# Patient Record
Sex: Female | Born: 1995
Health system: Southern US, Community
[De-identification: ages and names within clinical notes are randomized; demographics above are authoritative.]

## PROBLEM LIST (undated history)

## (undated) DIAGNOSIS — T7840XA Allergy, unspecified, initial encounter: Secondary | ICD-10-CM

## (undated) DIAGNOSIS — M1990 Unspecified osteoarthritis, unspecified site: Secondary | ICD-10-CM

## (undated) DIAGNOSIS — R011 Cardiac murmur, unspecified: Secondary | ICD-10-CM

## (undated) DIAGNOSIS — J45909 Unspecified asthma, uncomplicated: Secondary | ICD-10-CM

## (undated) DIAGNOSIS — G43909 Migraine, unspecified, not intractable, without status migrainosus: Secondary | ICD-10-CM

## (undated) HISTORY — DX: Allergy, unspecified, initial encounter: T78.40XA

## (undated) HISTORY — PX: NO PAST SURGERIES: SHX2092

## (undated) HISTORY — DX: Unspecified asthma, uncomplicated: J45.909

## (undated) HISTORY — DX: Unspecified osteoarthritis, unspecified site: M19.90

---
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
## 2015-02-27 ENCOUNTER — Ambulatory Visit (INDEPENDENT_AMBULATORY_CARE_PROVIDER_SITE_OTHER): Payer: PRIVATE HEALTH INSURANCE | Admitting: Family Medicine

## 2015-02-27 ENCOUNTER — Ambulatory Visit (INDEPENDENT_AMBULATORY_CARE_PROVIDER_SITE_OTHER): Payer: PRIVATE HEALTH INSURANCE

## 2015-02-27 VITALS — BP 108/72 | HR 78 | Temp 98.2°F | Resp 16 | Ht 72.5 in | Wt 167.0 lb

## 2015-02-27 DIAGNOSIS — N921 Excessive and frequent menstruation with irregular cycle: Secondary | ICD-10-CM

## 2015-02-27 DIAGNOSIS — M79672 Pain in left foot: Secondary | ICD-10-CM

## 2015-02-27 DIAGNOSIS — G8929 Other chronic pain: Secondary | ICD-10-CM

## 2015-02-27 DIAGNOSIS — F341 Dysthymic disorder: Secondary | ICD-10-CM | POA: Diagnosis not present

## 2015-02-27 DIAGNOSIS — M549 Dorsalgia, unspecified: Secondary | ICD-10-CM

## 2015-02-27 LAB — POCT CBC
Granulocyte percent: 73.3 %G (ref 37–80)
HEMATOCRIT: 41.7 % (ref 37.7–47.9)
Hemoglobin: 13.7 g/dL (ref 12.2–16.2)
Lymph, poc: 1.7 (ref 0.6–3.4)
MCH, POC: 29.3 pg (ref 27–31.2)
MCHC: 32.9 g/dL (ref 31.8–35.4)
MCV: 88.9 fL (ref 80–97)
MID (CBC): 0.4 (ref 0–0.9)
MPV: 7.9 fL (ref 0–99.8)
POC GRANULOCYTE: 5.8 (ref 2–6.9)
POC LYMPH %: 21.1 % (ref 10–50)
POC MID %: 5.6 % (ref 0–12)
Platelet Count, POC: 155 10*3/uL (ref 142–424)
RBC: 4.69 M/uL (ref 4.04–5.48)
RDW, POC: 13.1 %
WBC: 7.9 10*3/uL (ref 4.6–10.2)

## 2015-02-27 MED ORDER — NAPROXEN 500 MG PO TABS: 500.0000 mg | ORAL_TABLET | Freq: Two times a day (BID) | ORAL | Status: DC

## 2015-02-27 MED ORDER — CYCLOBENZAPRINE HCL 10 MG PO TABS: 10.0000 mg | ORAL_TABLET | Freq: Every day | ORAL | Status: DC

## 2015-02-27 MED ORDER — FLUOXETINE HCL 20 MG PO CAPS: 20.0000 mg | ORAL_CAPSULE | Freq: Every day | ORAL | Status: DC

## 2015-02-27 NOTE — Progress Notes (Signed)
02/27/2015 at 7:23 PM  Cindy Boyle / DOB: [birth year] / MRN: [id number]  The patient  does not have a problem list on file.  SUBJECTIVE  Cindy Boyle is a 19 y.o. female who complains of multiple problems.  She complains of left sided lower back pain that started many years ago and was told that she has a herniated disk.  She has occasional flares of pain.  She was playing volleyball 2 weeks ago this made her back sore but was manageable. Since that time her pain has worsened and is now a 9.5/10.   Denies urinary symptoms, incontinence, leg weakness and paresthesia.  There is no radicular pattern to the pain. Has tired Ibuprofen 800 mg with good relief.    She complains of menorrhagia that has been on going for roughly three weeks ago.  She received a depo shot at the onset of symptoms.  This has happened before with the depo shot.  She has tried OCPs and this causes her to have two periods a month.  She would like a referral to GYN.  She complains of left ankle pain that started 2 weeks ago during a volleyball game.  Reports she "rolled" her ankle.  Initially had bruising and swelling but this has resolved.  Reports pain along the lateral aspect of the foot and ankle with walking.  Complains of depressive symptoms that started 2 years ago when she was a senior in high school.  Complains of poor sleep, loss of interest, dysthymic mood and poor concentration.  Denies SI/HI.  Has never tried an SSRI.     She  has a past medical history of Heart murmur; Migraines; Allergy; Arthritis; and Asthma.    Medications reviewed and updated by myself where necessary, and exist elsewhere in the encounter.   Depression screen PHQ 2/9 02/27/2015  Decreased Interest 2  Down, Depressed, Hopeless 1  PHQ - 2 Score 3  Altered sleeping 3  Tired, decreased energy 3  Change in appetite 3  Feeling bad or failure about yourself  0  Trouble concentrating 3  Moving slowly or fidgety/restless 1    Suicidal thoughts 0  PHQ-9 Score 16      Cindy Boyle has No Known Allergies. She  reports that she has never smoked. She does not have any smokeless tobacco history on file. She reports that she drinks alcohol. She reports that she does not use illicit drugs. She  reports that she currently engages in sexual activity. The patient  has no past surgical history on file.  Her family history is not on file.  Review of Systems  Constitutional: Negative for fever and chills.  Eyes: Negative.   Respiratory: Negative for cough.   Cardiovascular: Negative for chest pain.  Gastrointestinal: Negative for nausea.  Genitourinary: Negative for dysuria, urgency, frequency and hematuria.  Musculoskeletal: Positive for back pain. Negative for myalgias, joint pain, falls and neck pain.  Skin: Negative for itching and rash.  Neurological: Negative for dizziness, weakness and headaches.  Endo/Heme/Allergies: Does not bruise/bleed easily.  Psychiatric/Behavioral: Positive for depression. The patient has insomnia.     OBJECTIVE  Her  height is 6' 0.5" (1.842 m) and weight is 167 lb (75.751 kg). Her temperature is 98.2 F (36.8 C). Her blood pressure is 108/72 and her pulse is 78. Her respiration is 16 and oxygen saturation is 99%.  The patient's body mass index is 22.33 kg/(m^2).  Physical Exam  Constitutional: She is oriented to person, place, and time.  She appears well-developed and well-nourished. No distress.  HENT:  Head: Normocephalic.  Right Ear: External ear normal.  Left Ear: External ear normal.  Nose: Nose normal.  Eyes: Conjunctivae are normal. Pupils are equal, round, and reactive to light.  Neck: No thyromegaly present.  Cardiovascular: Normal rate.   Respiratory: Effort normal and breath sounds normal.  GI: She exhibits no distension.  Musculoskeletal: Normal range of motion. She exhibits no edema.       Lumbar back: She exhibits normal range of motion, no tenderness, no bony  tenderness, no swelling, no edema, no deformity, no laceration, no pain, no spasm and normal pulse.       Feet:  Neurological: She is alert and oriented to person, place, and time.  Skin: Skin is warm and dry. She is not diaphoretic.  Psychiatric: She has a normal mood and affect. Her speech is normal and behavior is normal. Judgment and thought content normal. Cognition and memory are normal.    Results for orders placed or performed in visit on 02/27/15 (from the past 24 hour(s))  POCT CBC     Status: None   Collection Time: 02/27/15  6:59 PM  Result Value Ref Range   WBC 7.9 4.6 - 10.2 K/uL   Lymph, poc 1.7 0.6 - 3.4   POC LYMPH PERCENT 21.1 10 - 50 %L   MID (cbc) 0.4 0 - 0.9   POC MID % 5.6 0 - 12 %M   POC Granulocyte 5.8 2 - 6.9   Granulocyte percent 73.3 37 - 80 %G   RBC 4.69 4.04 - 5.48 M/uL   Hemoglobin 13.7 12.2 - 16.2 g/dL   HCT, POC 16.1 09.6 - 47.9 %   MCV 88.9 80 - 97 fL   MCH, POC 29.3 27 - 31.2 pg   MCHC 32.9 31.8 - 35.4 g/dL   RDW, POC 04.5 %   Platelet Count, POC 155 142 - 424 K/uL   MPV 7.9 0 - 99.8 fL   [facility] reading (PRIMARY) by  Dr. Alwyn Ren: No acute findings on spine or foot radiographs.   ASSESSMENT & PLAN  Cindy Boyle was seen today for back pain, ankle injury, depression and menstrual problem.  Diagnoses and all orders for this visit:  Foot pain, left -     DG Foot Complete Left; Future  Chronic back pain greater than 3 months duration -     DG Lumbar Spine Complete; Future  Dysthymia: Given my interview with patient this is possible as her exam could be gaurded, however I do not feel an axis 1 diagnosis is at play.  Will try low dose SSRI and have advised that she follow up in 6 weeks to further explore this problem.  -     FLUoxetine (PROZAC) 20 MG capsule; Take 1 capsule (20 mg total) by mouth daily.  Menorrhagia with irregular cycle -     Ambulatory referral to Gynecology   The patient was advised to call or come back to clinic if she does  not see an improvement in symptoms, or worsens with the above plan.   Deliah Boston, MHS, PA-C [facility] 02/27/2015 7:23 PM

## 2015-02-28 LAB — TSH: TSH: 0.762 u[IU]/mL (ref 0.350–4.500)

## 2015-02-28 NOTE — Progress Notes (Signed)
Patient's history and exam were discussed with Cindy BostonMichael Clark, PA-C. I did not examine the patient. X-rays were examined and no fracture noted. Treatment plan was discussed and agreed upon.  Peyton Najjaravid H Gracie Gupta M.D.

## 2015-03-09 ENCOUNTER — Telehealth: Payer: Self-pay

## 2015-03-09 NOTE — Telephone Encounter (Signed)
Ok for note? And what do I need to say?

## 2015-03-09 NOTE — Telephone Encounter (Signed)
Patient needs a note proving that she was seen and treated for chronic back pain on 02/27/15. She states that she's been missing classes and volley ball practice because her back isn't getting any better. Her teachers are requesting a note showing that she was actually treated for this issue.  Patient's states she needs it today. Phone: [phone]

## 2015-03-09 NOTE — Telephone Encounter (Signed)
Please provide a note stating that patient is being treated for back pain and include the day she was seen in the clinic.  Deliah BostonMichael Clark, MS, PA-C   3:38 PM, 03/09/2015

## 2015-03-12 NOTE — Telephone Encounter (Signed)
Left letter in pick up draw. Pt notified.

## 2015-03-12 NOTE — Telephone Encounter (Signed)
Left message for pt to call back  °

## 2016-02-06 ENCOUNTER — Other Ambulatory Visit: Payer: Self-pay | Admitting: Physician Assistant

## 2016-02-06 DIAGNOSIS — F341 Dysthymic disorder: Secondary | ICD-10-CM

## 2016-09-16 ENCOUNTER — Other Ambulatory Visit: Payer: Self-pay | Admitting: Orthopedic Surgery

## 2016-09-16 DIAGNOSIS — M25561 Pain in right knee: Secondary | ICD-10-CM

## 2016-10-07 ENCOUNTER — Ambulatory Visit
Admission: RE | Admit: 2016-10-07 | Discharge: 2016-10-07 | Disposition: A | Discharge status: Home / Self Care | Payer: PPO | Source: Ambulatory Visit | Attending: Orthopedic Surgery | Admitting: Orthopedic Surgery

## 2016-10-07 DIAGNOSIS — M25561 Pain in right knee: Secondary | ICD-10-CM

## 2016-11-03 ENCOUNTER — Other Ambulatory Visit (HOSPITAL_COMMUNITY)
Admission: RE | Admit: 2016-11-03 | Discharge: 2016-11-03 | Disposition: A | Discharge status: Home / Self Care | Payer: BLUE CROSS/BLUE SHIELD | Source: Ambulatory Visit | Attending: Obstetrics and Gynecology | Admitting: Obstetrics and Gynecology

## 2016-11-03 ENCOUNTER — Other Ambulatory Visit: Payer: Self-pay | Admitting: Obstetrics and Gynecology

## 2016-11-03 DIAGNOSIS — Z01419 Encounter for gynecological examination (general) (routine) without abnormal findings: Secondary | ICD-10-CM | POA: Diagnosis not present

## 2016-11-05 LAB — CYTOLOGY - PAP: DIAGNOSIS: NEGATIVE

## 2016-12-16 ENCOUNTER — Emergency Department (HOSPITAL_COMMUNITY)
Admission: EM | Admit: 2016-12-16 | Discharge: 2016-12-16 | Disposition: A | Discharge status: Home / Self Care | Payer: BLUE CROSS/BLUE SHIELD | Attending: Emergency Medicine | Admitting: Emergency Medicine

## 2016-12-16 ENCOUNTER — Encounter (HOSPITAL_COMMUNITY): Payer: Self-pay | Admitting: *Deleted

## 2016-12-16 ENCOUNTER — Emergency Department (HOSPITAL_COMMUNITY): Payer: BLUE CROSS/BLUE SHIELD

## 2016-12-16 DIAGNOSIS — M542 Cervicalgia: Secondary | ICD-10-CM | POA: Insufficient documentation

## 2016-12-16 DIAGNOSIS — Y939 Activity, unspecified: Secondary | ICD-10-CM | POA: Insufficient documentation

## 2016-12-16 DIAGNOSIS — M545 Low back pain, unspecified: Secondary | ICD-10-CM

## 2016-12-16 DIAGNOSIS — R51 Headache: Secondary | ICD-10-CM | POA: Insufficient documentation

## 2016-12-16 DIAGNOSIS — F0781 Postconcussional syndrome: Secondary | ICD-10-CM | POA: Diagnosis not present

## 2016-12-16 DIAGNOSIS — Y92410 Unspecified street and highway as the place of occurrence of the external cause: Secondary | ICD-10-CM | POA: Insufficient documentation

## 2016-12-16 DIAGNOSIS — Y999 Unspecified external cause status: Secondary | ICD-10-CM | POA: Diagnosis not present

## 2016-12-16 DIAGNOSIS — R410 Disorientation, unspecified: Secondary | ICD-10-CM | POA: Diagnosis not present

## 2016-12-16 DIAGNOSIS — S0990XA Unspecified injury of head, initial encounter: Secondary | ICD-10-CM | POA: Diagnosis present

## 2016-12-16 MED ORDER — KETOROLAC TROMETHAMINE 30 MG/ML IJ SOLN
30.0000 mg | Freq: Once | INTRAMUSCULAR | Status: AC
  Administered 2016-12-16: 30 mg via INTRAMUSCULAR
  Filled 2016-12-16: qty 1

## 2016-12-16 MED ORDER — METOCLOPRAMIDE HCL 10 MG PO TABS
5.0000 mg | ORAL_TABLET | Freq: Once | ORAL | Status: AC
Start: 2016-12-16 — End: 2016-12-16
  Administered 2016-12-16: 5 mg via ORAL
  Filled 2016-12-16: qty 1

## 2016-12-16 MED ORDER — DIPHENHYDRAMINE HCL 25 MG PO CAPS
25.0000 mg | ORAL_CAPSULE | Freq: Once | ORAL | Status: AC
  Administered 2016-12-16: 25 mg via ORAL
  Filled 2016-12-16: qty 1
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
## 2016-12-16 NOTE — ED Notes (Signed)
Jeff, PA at bedside at this time.  

## 2016-12-16 NOTE — Discharge Instructions (Signed)
Please read attached information. If you experience any new or worsening signs or symptoms please return to the emergency room for evaluation. Please follow-up with your primary care provider or specialist as discussed.  °
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
## 2016-12-16 NOTE — ED Notes (Signed)
Patient transported to X-ray 

## 2016-12-22 ENCOUNTER — Telehealth: Payer: Self-pay
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
## 2016-12-23 ENCOUNTER — Ambulatory Visit (INDEPENDENT_AMBULATORY_CARE_PROVIDER_SITE_OTHER): Payer: BLUE CROSS/BLUE SHIELD | Admitting: Family Medicine

## 2016-12-23 DIAGNOSIS — S060XAA Concussion with loss of consciousness status unknown, initial encounter: Secondary | ICD-10-CM | POA: Insufficient documentation

## 2016-12-23 DIAGNOSIS — S060X9A Concussion with loss of consciousness of unspecified duration, initial encounter: Secondary | ICD-10-CM | POA: Insufficient documentation

## 2016-12-23 DIAGNOSIS — S060X0A Concussion without loss of consciousness, initial encounter: Secondary | ICD-10-CM | POA: Diagnosis not present

## 2016-12-23 MED ORDER — TIZANIDINE HCL 4 MG PO TABS: 4.0000 mg | ORAL_TABLET | Freq: Every evening | ORAL | 2 refills | Status: AC
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
## 2016-12-23 NOTE — Patient Instructions (Addendum)
Good to see you  I do think you have a concussion.  I would like you to limit screen time (including your phone) to 30 minutes daily outside of homework or work.  Only light workout like biking.  In addition to this I recommend......  To help improve COGNITIVE function: Using fish oil/omega 3 that is 1000 mg (or roughly 600 mg EPA/DHA), starting as soon as possible after concussion, take: 3 tabs TWICE DAILY  for the next 3 days, then 3 tabs ONCE DAILY  for the next 10 days   To help reduce HEADACHES: Coenzyme Q10 160mg  ONCE DAILY Riboflavin/Vitamin B2 400mg  ONCE DAILY May stop after headaches are resolved.                                                                                             To help with INSOMNIA: Melatonin 3-5mg  AT BEDTIME    for the back pain- zanaflex at night for the pain   I want to see you again on Monday

## 2016-12-23 NOTE — Assessment & Plan Note (Signed)
Patient is in a fairly high and concussion in symptoms or. Impact does show that patient does have some difficulty with concentration as well as reaction time. Discussed with patient that this is likely more of a cognitive with a migraine type concussion. Seems to be more of a mix. We discussed over-the-counter medications, given a muscle relaxer to help with some of the back tightness she is complaining about as well. We discussed that patient will be limited to 4 hour shifts for the next week and further evaluation. Patient has restarted school in 10 days we will make sure she is doing well at that time.
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
## 2016-12-29 ENCOUNTER — Ambulatory Visit (INDEPENDENT_AMBULATORY_CARE_PROVIDER_SITE_OTHER): Payer: BLUE CROSS/BLUE SHIELD | Admitting: Family Medicine

## 2016-12-29 DIAGNOSIS — S060X0D Concussion without loss of consciousness, subsequent encounter: Secondary | ICD-10-CM | POA: Diagnosis not present

## 2016-12-29 MED ORDER — GABAPENTIN 100 MG PO CAPS: 200.0000 mg | ORAL_CAPSULE | Freq: Every day | ORAL | 3 refills | Status: DC

## 2016-12-29 MED ORDER — ONDANSETRON 4 MG PO TBDP: 4.0000 mg | ORAL_TABLET | Freq: Three times a day (TID) | ORAL | 0 refills | Status: DC | PRN

## 2016-12-29 NOTE — Patient Instructions (Addendum)
We are making progress We will get you moving a little more  Zofran up to 3 times a day for nausea Gabapentin 100-200mg  at night  See me again in 3 weeks or call sooner if trouble

## 2016-12-29 NOTE — Assessment & Plan Note (Signed)
Patient concussion testing does show some improvement. Patient states that she has not made significant improvement. Discussed with patient at great length. Patient will do another part-time week at work and then increase to full time. Patient is also starting school which is chronic contribute. Gabapentin given from some nighttime relief and warned her not to take it regularly. Patient will come back in for one more follow-up in 3 weeks and hopefully we'll be making some progress.

## 2017-01-19 NOTE — Progress Notes (Deleted)
Subjective:   @VITALSMCOMMENTS @  Chief Complaint: Cindy Boyle, DOB: 05/16/1996, is a 21 y.o. female who presents for No chief complaint on file.   Injury date : *** Visit #: ***  History of Present Illness:   Patient's goals/priorities: ***  CLASS CURRENT GRADE COMMENTS                               Concussion Self-Reported Symptom Score Symptoms rated on a scale 1-6, in last 24 hours  Headache: ***    Nausea: ***  Vomiting: ***  Balance Difficulty: ***   Dizziness: ***  Fatigue: ***  Trouble Falling Asleep: ***   Sleep More Than Usual: ***  Sleep Less Than Usual: ***  Daytime Drowsiness: ***  Photophobia: ***  Phonophobia: ***  Feeling anxious: ***  Confused: ***  Irritability: ***  Sadness: ***  Nervousness: ***  Feeling More Emotional: ***  Numbness or Tingling: ***  Feeling Slowed Down: ***  Feeling Mentally Foggy: ***  Difficulty Concentrating: ***  Difficulty Remembering: ***  Visual Problems: ***  Neck Pain: ***  Tinnitus: ***   Total Symptom Score: *** Previous Symptom Score: ***  Review of Systems: Pertinent items are noted in HPI.  Review of History: Past Medical History: @PMHP @  Past Surgical History:  has no past surgical history on file. Family History: family history is not on file. Social History:  reports that she has never smoked. She does not have any smokeless tobacco history on file. She reports that she drinks alcohol. She reports that she does not use drugs. Current Medications: has a current medication list which includes the following prescription(s): cyclobenzaprine, fluoxetine, gabapentin, ibuprofen, and ondansetron. Allergies: is allergic to other.  Objective:    Physical Examination There were no vitals filed for this visit. General appearance: alert, appears stated age and cooperative Head: Normocephalic, without obvious abnormality, atraumatic Eyes: conjunctivae/corneas clear. PERRL, EOM's intact. Fundi  benign. Sclera anicteric. Lungs: clear to auscultation bilaterally and percussion Heart: regular rate and rhythm, S1, S2 normal, no murmur, click, rub or gallop Neurologic: CN 2-12 normal.  Sensation to pain, touch, and proprioception normal.  DTRs  normal in upper and lower extremities. No pathologic reflexes. Neg rhomberg, modified rhomberg, pronator drift, tandem gait, finger-to-nose; see post-concussion vestibular and oculomotor testing in chart Psychiatric: Oriented X3, intact recent and remote memory, judgement and insight, normal mood and affect  Concussion testing performed today:  Neurocognitive testing (ImPACT):  Baseline:*** Post #1: ***   Verbal Memory Composite *** (***%) *** (***%)   Visual Memory Composite *** (***%) *** (***%)   Visual Motor Speed Composite *** (***%) *** (***%)   Reaction Time Composite *** (***%) *** (***%)   Cognitive Efficiency Index *** ***     Vestibular Screening:   Pre VOMS  HA Score: *** Pre VOMS  Dizziness Score: ***   Headache  Dizziness  Smooth Pursuits *** ***  H. Saccades *** ***  V. Saccades *** ***  H. VOR *** ***  V. VOR *** ***  Visual Motor Sensitivity *** ***  Accommodation Right: *** cm Left: *** cm *** ***  Convergence: *** cm Divergence: *** cm *** ***   Balance Screen: ***  Additional testing performed today: { :28529}   Assessment:    No diagnosis found.  Cindy Boyle presents with the following concussion subtypes. [] Cognitive [] Cervical [] Vestibular [] Ocular [] Migraine [] Anxiety/Mood   Plan:   Action/Discussion: Reviewed diagnosis, management options, expected outcomes, and  the reasons for scheduled and emergent follow-up. Questions were adequately answered. Patient expressed verbal understanding and agreement with the following plan.      Participation in school/work: Patient is cleared to return to work/school and activities of daily living without restrictions.  Patient is not cleared to  return to work/school until further notice.  Patient may return to work/school on ***, with the following restrictions/supports:  Length of Day:  Please allow patient to use *** class as study hall in a quiet area.  Shortened school/work day: Recommend *** until ***.  Recommend core classes only.  Extra Time:  Take mental rest breaks during the day as needed. Check for return of symptoms when participating in any activities that require a significant amount of attention or concentration.  Allow extra time to complete tasks.  Please allow *** weeks to make up missed assignments, test, quizzes.  Visual/Vestibular Accommodations in School:  Allow patient to eat lunch in quiet environment with 1-2 classmates.  Allow patient to leave class 5 minutes before end of period to avoid busy/noisy hallway.  Please provide any supplemental learning materials (power points, lecture notes, handouts, etc) in minimum size 18 font and allow/provide any auditory supplements to learning when possible (books on tape, audio tape lectures, etc) to limit visual stress in the classroom.  Patient is cleared for auditory participation only. Patient is not cleared for homework, quizzes, or tests at this time.   Testing:  May begin taking tests/quizzes on *** with no more than one test/quiz per day.   No significant classroom or standardized testing until ***.  Home/Extracurricular:  Lessen work/homework load to allow adequate cognitive rest. Work *** minutes with intervals of *** minute breaks (total *** hours).  Limit visual stimulants including: driving, watching television/movies, reading, using cell phone, etc. - to ensure relative visual cognitive rest. NOT cleared for video or phone games. May participate *** minutes with intervals of *** minute breaks (total *** hours).   Participation in physical activity: Patient is cleared to return to physical activity participation without  restrictions.  Patient is not cleared for formal physical activity (includes physical education class, sports practices, sports games, weight training, etc) at this time.   However, we recommend that patient has 20-30 minutes of light cardiovascular activity daily, with NO risk of head injury (example: walking), staying below level of symptoms.  Recommend gradual progression to *** vestibular exercise (30-45 min/day) with no risk of head trauma while staying below level of symptoms.  See your exercise treatment menu for details.   Begin your exercise prescription on ____________ (see separate exercise prescription form)  Cleared for physical activity that poses NO RISK of head trauma.   Gradual return to physical activity under the supervision of a physician and/or athletic trainer  Once asymptomatic for 24 hours, patient may start Stage *** of CFPSM's { :10024} Exercise Progression Protocol. This is to be monitored by patient's { :28383}    Patient may start Stage *** of CFPSM's { :10024} Exercise Progression Protocol. This is to be monitored by patient's { :28383}.   Patient is not cleared for full contact activities, activities with risk of head trauma or unsupervised physical activity while participating in CFPSM's Exercise Progression. Check for return of symptoms (using Concussion Education Form Symptom List) when participating in activity and 24 hours following. If symptoms return, patient to contact our office for further recommendations.    [facility] Medical Recommendations form has been given to patient allowing *** to progress patient  back into full participation.  Active Treatment Strategies:  Fueling your brain is important for recovery. It is essential to stay well hydrated, aiming for half of your body weight in fluid ounces per day (100 lbs = 50 oz). We also recommend eating breakfast to start your day and focus on a well-balanced diet containing lean protein, 'good' fats, and  complex carbohydrates. See your nutrition / hydration handout for more details.   Quality sleep is vital in your concussion recovery. We encourage lots of sleep for the first 24-72 hours after injury but following this period it is important to regulate your sleep cycle. We encourage *** hours of quality sleep per night. See your sleep handout for more details and strategies to quality sleep.  IF NOT USING THE OPTIONS BELOW DELETE THEM  Treating your vestibular and visual dysfunction will decrease your recovery time and improve your symptoms. Begin your home vestibular exercise program as directed on your AVS.    Begin taking Amantadine medicine as directed.   Begin taking DHA supplement as directed.    Begin home exercise program for neck as directed.   Follow-up information:  Follow up appointment at [facility] in *** .   Call [facility] at [phone] at least 24 hours after completion of Stage 4 with status update.  Patient needs to arrive 30 minutes prior to appointment to complete the following tests: { :28378}.    Patient Education:  Reviewed with patient the risks (i.e, a repeat concussion, post-concussion syndrome, second-impact syndrome) of returning to play prior to complete resolution, and thoroughly reviewed the signs and symptoms of concussion.Reviewed need for complete resolution of all symptoms, with rest AND exertion, prior to return to play.  Reviewed red flags for urgent medical evaluation: worsening symptoms, nausea/vomiting, intractable headache, musculoskeletal changes, focal neurological deficits.  [facility] Concussion Care Plan, which clearly outlines the plans stated above, was given to patient.  I was personally involved with the physical evaluation of and am in agreement with the assessment and treatment plan for this patient.  Greater than 50% of this encounter was spent in direct consultation with the patient in  evaluation, counseling, and coordination of care. Duration of encounter: { :28531} minutes.  After Visit Summary printed out and provided to patient as appropriate.  This note is written by Judi Saa, in the presence of and acting as the scribe of Judi Saa, DO.

## 2017-01-20 ENCOUNTER — Ambulatory Visit: Payer: BLUE CROSS/BLUE SHIELD | Admitting: Family Medicine

## 2017-09-29 ENCOUNTER — Encounter: Payer: PRIVATE HEALTH INSURANCE | Admitting: Allergy and Immunology

## 2017-09-30 NOTE — Progress Notes (Signed)
This encounter was created in error - please disregard.

## 2017-12-16 ENCOUNTER — Other Ambulatory Visit: Payer: Self-pay

## 2017-12-16 ENCOUNTER — Encounter: Payer: Self-pay | Admitting: Family Medicine

## 2017-12-16 ENCOUNTER — Encounter (HOSPITAL_COMMUNITY): Payer: Self-pay

## 2017-12-16 ENCOUNTER — Emergency Department (HOSPITAL_COMMUNITY)
Admission: EM | Admit: 2017-12-16 | Discharge: 2017-12-16 | Disposition: A | Discharge status: Home / Self Care | Payer: 59 | Attending: Emergency Medicine | Admitting: Emergency Medicine

## 2017-12-16 ENCOUNTER — Ambulatory Visit (INDEPENDENT_AMBULATORY_CARE_PROVIDER_SITE_OTHER): Payer: PRIVATE HEALTH INSURANCE | Admitting: Family Medicine

## 2017-12-16 VITALS — BP 134/90 | HR 111 | Temp 98.0°F | Resp 18

## 2017-12-16 DIAGNOSIS — J45909 Unspecified asthma, uncomplicated: Secondary | ICD-10-CM | POA: Insufficient documentation

## 2017-12-16 DIAGNOSIS — T7840XA Allergy, unspecified, initial encounter: Secondary | ICD-10-CM

## 2017-12-16 DIAGNOSIS — T7809XA Anaphylactic reaction due to other food products, initial encounter: Secondary | ICD-10-CM

## 2017-12-16 DIAGNOSIS — R0602 Shortness of breath: Secondary | ICD-10-CM

## 2017-12-16 DIAGNOSIS — R600 Localized edema: Secondary | ICD-10-CM | POA: Diagnosis present

## 2017-12-16 MED ORDER — EPINEPHRINE 0.3 MG/0.3ML IJ SOAJ
0.3000 mg | Freq: Once | INTRAMUSCULAR | Status: AC
  Administered 2017-12-16: 0.3 mg via INTRAMUSCULAR
  Filled 2017-12-16: qty 0.3

## 2017-12-16 MED ORDER — ALBUTEROL SULFATE (2.5 MG/3ML) 0.083% IN NEBU
2.5000 mg | INHALATION_SOLUTION | Freq: Once | RESPIRATORY_TRACT | Status: AC
  Administered 2017-12-16: 2.5 mg via RESPIRATORY_TRACT

## 2017-12-16 MED ORDER — METHYLPREDNISOLONE SODIUM SUCC 125 MG IJ SOLR
125.0000 mg | Freq: Once | INTRAMUSCULAR | Status: AC
  Administered 2017-12-16: 125 mg via INTRAVENOUS

## 2017-12-16 MED ORDER — DIPHENHYDRAMINE HCL 25 MG PO CAPS
50.0000 mg | ORAL_CAPSULE | Freq: Once | ORAL | Status: AC
  Administered 2017-12-16: 50 mg via ORAL

## 2017-12-16 MED ORDER — EPINEPHRINE 0.3 MG/0.3ML IJ SOAJ: 0.3000 mg | Freq: Once | INTRAMUSCULAR | 0 refills | Status: AC

## 2017-12-16 MED ORDER — DIPHENHYDRAMINE HCL 50 MG/ML IJ SOLN
50.0000 mg | Freq: Once | INTRAMUSCULAR | Status: AC
  Administered 2017-12-16: 50 mg via INTRAVENOUS

## 2017-12-16 MED ORDER — PREDNISONE 20 MG PO TABS: 40.0000 mg | ORAL_TABLET | Freq: Every day | ORAL | 0 refills | Status: AC

## 2017-12-16 MED ORDER — RANITIDINE HCL 150 MG PO TABS
300.0000 mg | ORAL_TABLET | Freq: Once | ORAL | Status: AC
  Administered 2017-12-16: 300 mg via ORAL

## 2017-12-16 NOTE — ED Notes (Signed)
Per MD: Continue NS bolus began with EMS.

## 2017-12-16 NOTE — ED Triage Notes (Signed)
Pt arrives [facility] from [facility] with complaints of an allergic reaction. Pt has an extensive list of food allergies. Pt reports that she ate a cherry and then within 2 mins she began to feel her mouth tingling and lip swelling, Coughing, SOB, hives. Pt was taken to  Urgent care where they administered Albuterol, Benadryl 50mg , Solu-Medrol 125mg  IV, Zantac 150mg  PO. Pt reports most symptoms have resolved with the exceptions of facial swelling and mild itching.

## 2017-12-16 NOTE — ED Triage Notes (Signed)
Pt states " I have a condition where my heart rate cannot get above 110 or I will pass out"

## 2017-12-16 NOTE — Discharge Instructions (Signed)

## 2017-12-16 NOTE — ED Provider Notes (Signed)
Emergency Department Provider Note   I have reviewed the triage vital signs and the nursing notes.   HISTORY  Chief Complaint Allergic Reaction   HPI Jasmine Awe is a 22 y.o. female with PMH of multiple allergies and prior anaphylaxis presents to the emergency department for evaluation of sudden onset and face swelling with shortness of breath and lightheadedness.  The patient was eating cherries this morning and believes this may have been the triggering event.  She did not have an EpiPen because of cost but has required one in the past.  No prior reaction to cherries.  She went to urgent care where she was given steroid, Benadryl, Zantac, and albuterol.  She was transferred to the emergency department for further monitoring.  EpiPen was not administered.  Patient states that her symptoms are improving significantly she continues to have some mild lip swelling.  Denies any abdominal/GI symptoms.   Past Medical History:  Diagnosis Date  . Allergy   . Arthritis   . Asthma   . Heart murmur   . Migraines     Patient Active Problem List   Diagnosis Date Noted  . Mild concussion 12/23/2016    History reviewed. No pertinent surgical history.    Allergies Banana; Corn oil; Daucus carota; Other; Dust mite extract; Molds & smuts; Tree extract; and Cherry  History reviewed. No pertinent family history.  Social History Social History   Tobacco Use  . Smoking status: Never Smoker  . Smokeless tobacco: Never Used  Substance Use Topics  . Alcohol use: Yes    Alcohol/week: 0.0 oz  . Drug use: No    Review of Systems  Constitutional: No fever/chills Eyes: No visual changes. ENT: No sore throat. Positive lip/face swelling.  Cardiovascular: Denies chest pain. Respiratory: Positive shortness of breath. Gastrointestinal: No abdominal pain.  No nausea, no vomiting.  No diarrhea.  No constipation. Genitourinary: Negative for dysuria. Musculoskeletal: Negative for  back pain. Skin: Negative for rash. Neurological: Negative for headaches, focal weakness or numbness.  10-point ROS otherwise negative.  ____________________________________________   PHYSICAL EXAM:  VITAL SIGNS: ED Triage Vitals  Enc Vitals Group     BP 12/16/17 1015 136/87     Pulse Rate 12/16/17 1015 85     Resp 12/16/17 1015 18     Temp 12/16/17 1015 98.9 F (37.2 C)     Temp Source 12/16/17 1015 Oral     SpO2 12/16/17 1015 100 %     Weight 12/16/17 1017 193 lb (87.5 kg)     Height 12/16/17 1017 6\' 1"  (1.854 m)     Pain Score 12/16/17 1016 0   Constitutional: Alert and oriented. Well appearing and in no acute distress. Eyes: Conjunctivae are normal.  Head: Atraumatic. Nose: No congestion/rhinnorhea. Mouth/Throat: Mucous membranes are moist.  Oropharynx non-erythematous. Mild lower lip and left face swelling. Managing oral secretions and speaking in a clear voice. No tongue swelling. Posterior pharynx easily visualized.  Neck: No stridor. Cardiovascular: Normal rate, regular rhythm. Good peripheral circulation. Grossly normal heart sounds.   Respiratory: Normal respiratory effort.  No retractions. Lungs CTAB. Gastrointestinal: Soft and nontender. No distention.  Musculoskeletal: No lower extremity tenderness nor edema. No gross deformities of extremities. Neurologic:  Normal speech and language. No gross focal neurologic deficits are appreciated.  Skin:  Skin is warm, dry and intact. No rash noted.  ____________________________________________  RADIOLOGY  None ____________________________________________   PROCEDURES  Procedure(s) performed:   Procedures  None ____________________________________________   INITIAL  IMPRESSION / ASSESSMENT AND PLAN / ED COURSE  Pertinent labs & imaging results that were available during my care of the patient were reviewed by me and considered in my medical decision making (see chart for details).  Patient presents to the  emergency department with allergic reaction symptoms likely from cherries.  Patient given multiple medications prior to arrival and is feeling better.  No indication for epinephrine at this time but will monitor here in the emergency department for rebound symptoms.   Patient observed in the ED for 2 hours. Patient with discharged with EpiPen in hand for home use along with Rx sent for EpiPen and Prednisone burst. Plan for PCP follow up.   At this time, I do not feel there is any life-threatening condition present. I have reviewed and discussed all results (EKG, imaging, lab, urine as appropriate), exam findings with patient. I have reviewed nursing notes and appropriate previous records.  I feel the patient is safe to be discharged home without further emergent workup. Discussed usual and customary return precautions. Patient and family (if present) verbalize understanding and are comfortable with this plan.  Patient will follow-up with their primary care provider. If they do not have a primary care provider, information for follow-up has been provided to them. All questions have been answered.  ____________________________________________  FINAL CLINICAL IMPRESSION(S) / ED DIAGNOSES  Final diagnoses:  Allergic reaction, initial encounter     MEDICATIONS GIVEN DURING THIS VISIT:  Medications  EPINEPHrine (EPI-PEN) injection 0.3 mg (0.3 mg Intramuscular Given 12/16/17 1214)     NEW OUTPATIENT MEDICATIONS STARTED DURING THIS VISIT:  Discharge Medication List as of 12/16/2017 11:51 AM    START taking these medications   Details  EPINEPHrine (EPIPEN 2-PAK) 0.3 mg/0.3 mL IJ SOAJ injection Inject 0.3 mLs (0.3 mg total) into the muscle once for 1 dose., Starting Wed 12/16/2017, Normal    predniSONE (DELTASONE) 20 MG tablet Take 2 tablets (40 mg total) by mouth daily for 4 days., Starting Thu 12/17/2017, Until Mon 12/21/2017, Normal        Note:  This document was prepared using Dragon voice  recognition software and may include unintentional dictation errors.  Alona BeneJoshua Jaculin Rasmus, MD Emergency Medicine    Lakai Moree, Arlyss RepressJoshua G, MD 12/16/17 [phone]

## 2017-12-16 NOTE — ED Notes (Signed)
Bed: WA13 Expected date:  Expected time:  Means of arrival:  Comments: EMS allergic rxn 

## 2017-12-16 NOTE — Progress Notes (Signed)
Subjective:    Patient ID: Cindy Boyle, female    DOB: 1995/12/29, 22 y.o.   MRN: [id number]  HPI  Pt walked in off the street with her female partner.  Stated she was having an allergic reaction to cherries she had eaten that morning. She had never had them prior but has numerous food allergies.    She is not on any medication. She had not taken any medication, including any otc medication at all, and not tried anything for this allergic reaction other than come to the doctor.  She was feeling her lips swell, she was having difficulty breathing -can't get a deep breath, and her face was feeling swollen and itchy.  She has numerous food and environmental allergies for which she should carry an epi-pen but has not been able to afford to have one.  Past Medical History:  Diagnosis Date  . Allergy   . Arthritis   . Asthma   . Heart murmur   . Migraines    No current outpatient medications on file prior to visit.   No current facility-administered medications on file prior to visit.    Allergies  Allergen Reactions  . Other     List of food allergies     Review of Systems  Respiratory: Positive for chest tightness and shortness of breath. Negative for cough, choking, wheezing and stridor.   Cardiovascular: Positive for palpitations. Negative for chest pain and leg swelling.  Allergic/Immunologic: Positive for environmental allergies and food allergies.  Neurological: Positive for dizziness, tremors, speech difficulty, weakness, light-headedness and numbness. Negative for syncope.   See hpi    Objective:   Physical Exam  Constitutional: She is oriented to person, place, and time. She appears well-developed and well-nourished. She appears distressed.  Leaning forward in bed, tachypneic with shallow breaths initially upon evaluation.  Lungs clear, heart RRR, good perfusion to extremities  HENT:  Head: Normocephalic and atraumatic.  Right Ear: External ear normal.  Left  Ear: External ear normal.  Mouth/Throat: Uvula is midline. No uvula swelling. Posterior oropharyngeal erythema present. No oropharyngeal exudate or posterior oropharyngeal edema.  Eyes: Conjunctivae are normal. No scleral icterus.  Neck: Normal range of motion. Neck supple. No thyromegaly present.  Cardiovascular: Regular rhythm, normal heart sounds and intact distal pulses. Tachycardia present.  Pulses:      Radial pulses are 2+ on the right side, and 2+ on the left side.  Pulmonary/Chest: Effort normal. No accessory muscle usage or stridor. Tachypnea noted. No respiratory distress. She has decreased breath sounds (but lungs clear throughout).  Nebulized ALBUTEROL treatment given once after which RR returned to normal, pt able to lay back in bed with good air movement, lungs clear, O2 sat ranged 99-100%  Musculoskeletal: She exhibits no edema.  Lymphadenopathy:    She has no cervical adenopathy.  Neurological: She is alert and oriented to person, place, and time.  Skin: Skin is warm and dry. She is not diaphoretic. No erythema.  Psychiatric: She has a normal mood and affect. Her behavior is normal.   BP 134/90   Pulse (!) 111   Temp 98 F (36.7 C) (Oral)   Resp 18   SpO2 100%      Assessment & Plan:   1. Allergic reaction, initial encounter   Likely allergic reaction to cherries - developing anaphylactic type reaction. She was immediately treated with albuterol nebulized treatment and we began to try to establish IV access. Pt given diphenhydramine 50mg  po x 1  and ranitidine 300 mg po x 1 as we were having difficulty getting an IV start and unsure we were going to be able to obtain IV access. Approximately 5 minutes after oral med, IV access was establish and pt was adminstered Solumedrol 125mg  IV and benadryl 50mg  IV. Pt was kept on pulse oximetry and continued to ask what her pulse was.  At this point, pt states she has a cardiac arrhythmia and has been told to never leg her HR go  abvoe 110 or else she would pass out.  Up to that time, pt's HR had been ranging from 100-110 - occ high 90s, occ 110s, no presyncopal episodes. Her sxs were improving with reg respiratory rate, no sign of distress, and a decrease in her facial hives. However, she was unable to provide me any additional information on her cardiac conditions and significant limitations in cardiac rate so I determined that she needed to be monitored on telemetry for several hours until these was sure to be no recurrence of sxs as meds wore off.   911 called, emergent EMS with telemetry [facility].  They arrived 9:40 and pt transported by 9:45 to [facility]   Meds ordered this encounter  Medications  . diphenhydrAMINE (BENADRYL) capsule 50 mg  . ranitidine (ZANTAC) tablet 300 mg  . methylPREDNISolone sodium succinate (SOLU-MEDROL) 125 mg/2 mL injection 125 mg  . diphenhydrAMINE (BENADRYL) injection 50 mg  . albuterol (PROVENTIL) (2.5 MG/3ML) 0.083% nebulizer solution 2.5 mg     Cindy Boyle, M.D.  [facility] at [facility]  [place] [address] [place], [place] [address] [phone] phone [phone] fax  12/16/17 9:40 AM

## 2017-12-16 NOTE — Patient Instructions (Signed)
     IF you received an x-ray today, you will receive an invoice from [facility]. Please contact [facility] at [phone] with questions or concerns regarding your invoice.   IF you received labwork today, you will receive an invoice from [facility]. Please contact [facility] at [phone] with questions or concerns regarding your invoice.   Our billing staff will not be able to assist you with questions regarding bills from these companies.  You will be contacted with the lab results as soon as they are available. The fastest way to get your results is to activate your My Chart account. Instructions are located on the last page of this paperwork. If you have not heard from us regarding the results in 2 weeks, please contact this office.     

## 2018-09-03 ENCOUNTER — Other Ambulatory Visit: Payer: Self-pay

## 2018-09-03 ENCOUNTER — Emergency Department (HOSPITAL_COMMUNITY)
Admission: EM | Admit: 2018-09-03 | Discharge: 2018-09-04 | Disposition: A | Discharge status: Home / Self Care | Payer: 59 | Attending: Emergency Medicine | Admitting: Emergency Medicine

## 2018-09-03 ENCOUNTER — Encounter (HOSPITAL_COMMUNITY): Payer: Self-pay | Admitting: Emergency Medicine

## 2018-09-03 DIAGNOSIS — R112 Nausea with vomiting, unspecified: Secondary | ICD-10-CM | POA: Insufficient documentation

## 2018-09-03 DIAGNOSIS — R05 Cough: Secondary | ICD-10-CM | POA: Diagnosis not present

## 2018-09-03 DIAGNOSIS — Z8709 Personal history of other diseases of the respiratory system: Secondary | ICD-10-CM | POA: Diagnosis not present

## 2018-09-03 DIAGNOSIS — N10 Acute tubulo-interstitial nephritis: Secondary | ICD-10-CM | POA: Insufficient documentation

## 2018-09-03 DIAGNOSIS — N12 Tubulo-interstitial nephritis, not specified as acute or chronic: Secondary | ICD-10-CM

## 2018-09-03 DIAGNOSIS — R1030 Lower abdominal pain, unspecified: Secondary | ICD-10-CM | POA: Diagnosis not present

## 2018-09-03 DIAGNOSIS — R0602 Shortness of breath: Secondary | ICD-10-CM | POA: Diagnosis not present

## 2018-09-03 DIAGNOSIS — R509 Fever, unspecified: Secondary | ICD-10-CM

## 2018-09-03 LAB — CBC WITH DIFFERENTIAL/PLATELET
Abs Immature Granulocytes: 0.06 10*3/uL (ref 0.00–0.07)
Basophils Absolute: 0 10*3/uL (ref 0.0–0.1)
Basophils Relative: 0 %
Eosinophils Absolute: 0.1 10*3/uL (ref 0.0–0.5)
Eosinophils Relative: 1 %
HCT: 37.7 % (ref 36.0–46.0)
Hemoglobin: 12.7 g/dL (ref 12.0–15.0)
Immature Granulocytes: 1 %
Lymphocytes Relative: 13 %
Lymphs Abs: 1.2 10*3/uL (ref 0.7–4.0)
MCH: 30.1 pg (ref 26.0–34.0)
MCHC: 33.7 g/dL (ref 30.0–36.0)
MCV: 89.3 fL (ref 80.0–100.0)
Monocytes Absolute: 1.1 10*3/uL — ABNORMAL HIGH (ref 0.1–1.0)
Monocytes Relative: 11 %
Neutro Abs: 7.2 10*3/uL (ref 1.7–7.7)
Neutrophils Relative %: 74 %
Platelets: 161 10*3/uL (ref 150–400)
RBC: 4.22 MIL/uL (ref 3.87–5.11)
RDW: 12.1 % (ref 11.5–15.5)
WBC: 9.7 10*3/uL (ref 4.0–10.5)
nRBC: 0 % (ref 0.0–0.2)

## 2018-09-03 LAB — COMPREHENSIVE METABOLIC PANEL
ALT: 17 U/L (ref 0–44)
AST: 23 U/L (ref 15–41)
Albumin: 3.7 g/dL (ref 3.5–5.0)
Alkaline Phosphatase: 55 U/L (ref 38–126)
Anion gap: 11 (ref 5–15)
BUN: 6 mg/dL (ref 6–20)
CO2: 22 mmol/L (ref 22–32)
Calcium: 9.1 mg/dL (ref 8.9–10.3)
Chloride: 104 mmol/L (ref 98–111)
Creatinine, Ser: 1.08 mg/dL — ABNORMAL HIGH (ref 0.44–1.00)
GFR calc Af Amer: >60 mL/min (ref >60)
GFR calc non Af Amer: >60 mL/min (ref >60)
Glucose, Bld: 104 mg/dL — ABNORMAL HIGH (ref 70–99)
Potassium: 3.4 mmol/L — ABNORMAL LOW (ref 3.5–5.1)
Sodium: 137 mmol/L (ref 135–145)
Total Bilirubin: 0.8 mg/dL (ref 0.3–1.2)
Total Protein: 7.6 g/dL (ref 6.5–8.1)

## 2018-09-03 MED ORDER — ALBUTEROL SULFATE (2.5 MG/3ML) 0.083% IN NEBU: 5.0000 mg | INHALATION_SOLUTION | Freq: Once | RESPIRATORY_TRACT | Status: DC

## 2018-09-03 NOTE — ED Triage Notes (Signed)
Pt reports fever for 4 days, states she took Ibprofen  6 hours ago, no fever at this time.  Reports she now has a cough, SOB upon exertion.  Very weak, not eating much.  Pt reports headache and body aches.

## 2018-09-04 ENCOUNTER — Emergency Department (HOSPITAL_COMMUNITY): Payer: 59

## 2018-09-04 LAB — URINALYSIS, ROUTINE W REFLEX MICROSCOPIC
Bilirubin Urine: NEGATIVE
Glucose, UA: NEGATIVE mg/dL
Ketones, ur: 20 mg/dL — AB
Nitrite: POSITIVE — AB
Protein, ur: NEGATIVE mg/dL
Specific Gravity, Urine: 1.013 (ref 1.005–1.030)
pH: 5 (ref 5.0–8.0)

## 2018-09-04 LAB — I-STAT BETA HCG BLOOD, ED (MC, WL, AP ONLY): I-stat hCG, quantitative: <5 m[IU]/mL (ref <5)

## 2018-09-04 MED ORDER — CEPHALEXIN 500 MG PO CAPS: 500.0000 mg | ORAL_CAPSULE | Freq: Three times a day (TID) | ORAL | 0 refills | Status: AC

## 2018-09-04 MED ORDER — ONDANSETRON 4 MG PO TBDP
4.0000 mg | ORAL_TABLET | Freq: Once | ORAL | Status: AC
  Administered 2018-09-04: 4 mg via ORAL
  Filled 2018-09-04: qty 1

## 2018-09-04 MED ORDER — ONDANSETRON HCL 4 MG/2ML IJ SOLN
4.0000 mg | Freq: Once | INTRAMUSCULAR | Status: AC
  Administered 2018-09-04: 4 mg via INTRAVENOUS
  Filled 2018-09-04: qty 2

## 2018-09-04 MED ORDER — CEPHALEXIN 250 MG PO CAPS
500.0000 mg | ORAL_CAPSULE | Freq: Once | ORAL | Status: AC
  Administered 2018-09-04: 500 mg via ORAL
  Filled 2018-09-04: qty 2

## 2018-09-04 MED ORDER — SODIUM CHLORIDE 0.9 % IV BOLUS
1000.0000 mL | Freq: Once | INTRAVENOUS | Status: AC
  Administered 2018-09-04: 1000 mL via INTRAVENOUS

## 2018-09-04 NOTE — ED Provider Notes (Signed)
[facility] EMERGENCY DEPARTMENT Provider Note  CSN: [id number] Arrival date & time: 09/03/18 2219  Chief Complaint(s) Fever; Cough; and Shortness of Breath  HPI Cindy Boyle is a 23 y.o. female who presents to the emergency department with 5 days of fever, chills, and myalgias.  T-max of 102.  She been treating the fever with ibuprofen.  States that she developed nausea and vomiting.  Also developed cough and rhinorrhea earlier today.  No known sick contacts at home.  Patient still working at a [employer], broadcasting/film/video and states that she has very minimal protective equipment.  She denies any known COVID positive exposure.  Additionally patient endorses lower abdominal discomfort and foul-smelling urine.  No dysuria.  Denies any other physical complaints.  HPI  Past Medical History Past Medical History:  Diagnosis Date  . Allergy   . Arthritis   . Asthma   . Heart murmur   . Migraines    Patient Active Problem List   Diagnosis Date Noted  . Mild concussion 12/23/2016   Home Medication(s) Prior to Admission medications   Medication Sig Start Date End Date Taking? Authorizing Provider  cephALEXin (KEFLEX) 500 MG capsule Take 1 capsule (500 mg total) by mouth 3 (three) times daily for 14 days. 09/04/18 09/18/18  Nira Conn, MD                                                                                                                                    Past Surgical History History reviewed. No pertinent surgical history. Family History No family history on file.  Social History Social History   Tobacco Use  . Smoking status: Never Smoker  . Smokeless tobacco: Never Used  Substance Use Topics  . Alcohol use: Yes    Alcohol/week: 0.0 standard drinks  . Drug use: No   Allergies Banana; Corn oil; Daucus carota; Other; Dust mite extract; Molds & smuts; Tree extract; and Cherry  Review of Systems Review of Systems All other systems are reviewed  and are negative for acute change except as noted in the HPI  Physical Exam Vital Signs  I have reviewed the triage vital signs BP 117/78   Pulse 72   Temp 98.1 F (36.7 C) (Oral)   Resp 17   Ht  (1.854 m)   Wt 90.3 kg   SpO2 99%   BMI 26.25 kg/m   Physical Exam Vitals signs reviewed.  Constitutional:      General: She is not in acute distress.    Appearance: She is well-developed. She is not diaphoretic.  HENT:     Head: Normocephalic and atraumatic.     Nose: Nose normal.  Eyes:     General: No scleral icterus.       Right eye: No discharge.        Left eye: No discharge.     Conjunctiva/sclera: Conjunctivae normal.  Pupils: Pupils are equal, round, and reactive to light.  Neck:     Musculoskeletal: Normal range of motion and neck supple.  Cardiovascular:     Rate and Rhythm: Normal rate and regular rhythm.     Heart sounds: No murmur. No friction rub. No gallop.   Pulmonary:     Effort: Pulmonary effort is normal. No respiratory distress.     Breath sounds: Normal breath sounds. No stridor. No rales.  Abdominal:     General: There is no distension.     Palpations: Abdomen is soft.     Tenderness: There is no abdominal tenderness.  Musculoskeletal:        General: No tenderness.  Skin:    General: Skin is warm and dry.     Findings: No erythema or rash.  Neurological:     Mental Status: She is alert and oriented to person, place, and time.     ED Results and Treatments Labs (all labs ordered are listed, but only abnormal results are displayed) Labs Reviewed  COMPREHENSIVE METABOLIC PANEL - Abnormal; Notable for the following components:      Result Value   Potassium 3.4 (*)    Glucose, Bld 104 (*)    Creatinine, Ser 1.08 (*)    All other components within normal limits  CBC WITH DIFFERENTIAL/PLATELET - Abnormal; Notable for the following components:   Monocytes Absolute 1.1 (*)    All other components within normal limits  URINALYSIS, ROUTINE  W REFLEX MICROSCOPIC - Abnormal; Notable for the following components:   APPearance HAZY (*)    Hgb urine dipstick MODERATE (*)    Ketones, ur 20 (*)    Nitrite POSITIVE (*)    Leukocytes,Ua SMALL (*)    Bacteria, UA MANY (*)    All other components within normal limits  I-STAT BETA HCG BLOOD, ED ([facility], [facility], [facility] ONLY)                                                                                                                         EKG  EKG Interpretation  Date/Time:  Saturday September 04 2018 01:22:41 EDT Ventricular Rate:  107 PR Interval:    QRS Duration: 94 QT Interval:  330 QTC Calculation: 441 R Axis:   73 Text Interpretation:  Sinus tachycardia NO STEMI. No old tracing to compare Confirmed by Drema Pry [phone]) on 09/04/2018 1:32:32 AM      Radiology Dg Chest Port 1 View  Result Date: 09/04/2018 CLINICAL DATA:  Fever for several days EXAM: PORTABLE CHEST 1 VIEW COMPARISON:  None. FINDINGS: The heart size and mediastinal contours are within normal limits. Both lungs are clear. The visualized skeletal structures are unremarkable. IMPRESSION: No active disease. Electronically Signed   By: Alcide Clever M.D.   On: 09/04/2018 02:25   Pertinent labs & imaging results that were available during my care of the patient were reviewed by me and considered in my medical decision making (see chart for  details).  Medications Ordered in ED Medications  albuterol (PROVENTIL) (2.5 MG/3ML) 0.083% nebulizer solution 5 mg (has no administration in time range)  ondansetron (ZOFRAN-ODT) disintegrating tablet 4 mg (4 mg Oral Given 09/04/18 0019)  sodium chloride 0.9 % bolus 1,000 mL (0 mLs Intravenous Stopped 09/04/18 0438)  cephALEXin (KEFLEX) capsule 500 mg (500 mg Oral Given 09/04/18 0513)  ondansetron (ZOFRAN) injection 4 mg (4 mg Intravenous Given 09/04/18 0513)                                                                                                                                     Procedures Procedures  (including critical care time)  Medical Decision Making / ED Course I have reviewed the nursing notes for this encounter and the patient's prior records (if available in EHR or on provided paperwork).    Patient with a low-grade temp with stable vital signs.  Patient just began having respiratory symptoms today.  Has been having fever and myalgias with associated nausea, vomiting and foul-smelling urine for several days.  Low suspicion for viral process including COVID-19 at this time but cannot completely rule out.  Labs are reassuring without leukocytosis or anemia.  No significant electrolyte derangements.  She does have mild renal insufficiency likely from dehydration.  Urine was consistent with a urinary tract infection.  Given her nausea vomiting, will treat for pyelonephritis.  She was given nausea medication and able to tolerate oral intake including her antibiotics in the emergency department.  Recommended she continue to isolate.  The patient appears reasonably screened and/or stabilized for discharge and I doubt any other medical condition or other [facility] requiring further screening, evaluation, or treatment in the ED at this time prior to discharge.  The patient is safe for discharge with strict return precautions.  Final Clinical Impression(s) / ED Diagnoses Final diagnoses:  Fever  Pyelonephritis    Disposition: Discharge  Condition: Good  I have discussed the results, Dx and Tx plan with the patient who expressed understanding and agree(s) with the plan. Discharge instructions discussed at great length. The patient was given strict return precautions who verbalized understanding of the instructions. No further questions at time of discharge.    ED Discharge Orders         Ordered    cephALEXin (KEFLEX) 500 MG capsule  3 times daily     09/04/18 9476           Follow Up: Primary care provider  Schedule an appointment as soon as  possible for a visit  As needed  [facility] EMERGENCY DEPARTMENT [address] [id number] mc [place] [address] [phone]  If you are unable to keep your antibiotic down or hydrate orally.     This chart was dictated using voice recognition software.  Despite best efforts to proofread,  errors can occur which can change the documentation meaning.   Drema Pry  Domingo CockingEduardo, MD 09/04/18 [phone]

## 2020-03-22 ENCOUNTER — Ambulatory Visit
Admission: EM | Admit: 2020-03-22 | Discharge: 2020-03-22 | Disposition: A | Discharge status: Home / Self Care | Payer: 59 | Attending: Emergency Medicine | Admitting: Emergency Medicine

## 2020-03-22 ENCOUNTER — Ambulatory Visit: Payer: Self-pay

## 2020-03-22 DIAGNOSIS — R059 Cough, unspecified: Secondary | ICD-10-CM | POA: Insufficient documentation

## 2020-03-22 DIAGNOSIS — B349 Viral infection, unspecified: Secondary | ICD-10-CM | POA: Insufficient documentation

## 2020-03-22 LAB — POCT RAPID STREP A (OFFICE): Rapid Strep A Screen: NEGATIVE

## 2020-03-22 MED ORDER — FEXOFENADINE HCL 60 MG PO TABS: 60.0000 mg | ORAL_TABLET | Freq: Two times a day (BID) | ORAL | 0 refills | Status: DC

## 2020-03-22 MED ORDER — FLUTICASONE PROPIONATE 50 MCG/ACT NA SUSP: 1.0000 | Freq: Every day | NASAL | 0 refills | Status: AC

## 2020-03-22 MED ORDER — PREDNISONE 50 MG PO TABS: 50.0000 mg | ORAL_TABLET | Freq: Every day | ORAL | 0 refills | Status: DC

## 2020-03-22 MED ORDER — BENZONATATE 100 MG PO CAPS: 100.0000 mg | ORAL_CAPSULE | Freq: Three times a day (TID) | ORAL | 0 refills | Status: DC

## 2020-03-22 NOTE — ED Triage Notes (Addendum)
Pt presents with complaints of cough, fever, and nasal congestion x 1 week. reports fevers of 101 at home. Pt also endorses sore throat and generalized fatigue and weakness. Reports she had a negative rapid covid test this week. Reports minimal relief with otc cough medication and decongestant. Cough is dry and non productive

## 2020-03-22 NOTE — ED Provider Notes (Signed)
[facility]    CSN: [id number] Arrival date & time: 03/22/20  1150      History   Chief Complaint Chief Complaint  Patient presents with  . Cough    HPI Cindy Boyle is a 24 y.o. female  Cindy Boyle is a 24 y.o. female here for evaluation of a cough.  The cough is non-productive, without wheezing, dyspnea or hemoptysis and is aggravated by nothing. Onset of symptoms was 1 week ago, unchanged since that time.  Associated symptoms include fever, congestion, sore throat. Patient does have a history of asthma. Patient has not had recent travel. Patient does not have a history of smoking. Patient  has not had a previous chest x-ray. Patient has not had a PPD done. The following portions of the patient's history were reviewed and updated as appropriate: allergies, current medications, past family history, past medical history, past social history, past surgical history and problem list.     Past Medical History:  Diagnosis Date  . Allergy   . Arthritis   . Asthma   . Heart murmur   . Migraines     Patient Active Problem List   Diagnosis Date Noted  . Mild concussion 12/23/2016    History reviewed. No pertinent surgical history.  OB History   No obstetric history on file.      Home Medications    Prior to Admission medications   Medication Sig Start Date End Date Taking? Authorizing Provider  benzonatate (TESSALON) 100 MG capsule Take 1 capsule (100 mg total) by mouth every 8 (eight) hours. 03/22/20   Hall-Potvin, [place], PA-C  fexofenadine (ALLEGRA) 60 MG tablet Take 1 tablet (60 mg total) by mouth 2 (two) times daily. 03/22/20   Hall-Potvin, [place], PA-C  fluticasone (FLONASE) 50 MCG/ACT nasal spray Place 1 spray into both nostrils daily. 03/22/20   Hall-Potvin, [place], PA-C  predniSONE (DELTASONE) 50 MG tablet Take 1 tablet (50 mg total) by mouth daily with breakfast. 03/22/20   Hall-Potvin, [place], PA-C    Family History Family History   Problem Relation Age of Onset  . Healthy Mother   . Healthy Father     Social History Social History   Tobacco Use  . Smoking status: Never Smoker  . Smokeless tobacco: Never Used  Substance Use Topics  . Alcohol use: Yes    Alcohol/week: 0.0 standard drinks  . Drug use: No     Allergies   Banana, Corn oil, Daucus carota, Other, Dust mite extract, Molds & smuts, Tree extract, and Cherry   Review of Systems Review of Systems  Constitutional: Positive for activity change, appetite change and fever. Negative for fatigue.  HENT: Positive for congestion, sneezing and sore throat. Negative for ear pain, sinus pain and voice change.   Eyes: Negative for pain, redness and visual disturbance.  Respiratory: Positive for cough. Negative for shortness of breath.   Cardiovascular: Negative for chest pain and palpitations.  Gastrointestinal: Negative for abdominal pain, diarrhea, nausea and vomiting.  Musculoskeletal: Negative for arthralgias and myalgias.  Skin: Negative for rash and wound.  Neurological: Negative for syncope and headaches.     Physical Exam Triage Vital Signs ED Triage Vitals  Enc Vitals Group     BP      Pulse      Resp      Temp      Temp src      SpO2      Weight      Height  Head Circumference      Peak Flow      Pain Score      Pain Loc      Pain Edu?      Excl. in GC?    No data found.  Updated Vital Signs BP 121/83   Pulse 98   Temp 98.9 F (37.2 C)   Resp 19   LMP 01/24/2020   SpO2 97%   Visual Acuity Right Eye Distance:   Left Eye Distance:   Bilateral Distance:    Right Eye Near:   Left Eye Near:    Bilateral Near:     Physical Exam Constitutional:      General: She is not in acute distress.    Appearance: She is not ill-appearing or diaphoretic.  HENT:     Head: Normocephalic and atraumatic.     Right Ear: Tympanic membrane and ear canal normal.     Left Ear: Tympanic membrane and ear canal normal.     Nose:      Comments: Significant L turbinate edema    Mouth/Throat:     Mouth: Mucous membranes are moist.     Pharynx: Oropharynx is clear. No oropharyngeal exudate or posterior oropharyngeal erythema.  Eyes:     General: No scleral icterus.    Conjunctiva/sclera: Conjunctivae normal.     Pupils: Pupils are equal, round, and reactive to light.  Neck:     Comments: Trachea midline, negative JVD Cardiovascular:     Rate and Rhythm: Normal rate and regular rhythm.     Heart sounds: No murmur heard.  No gallop.   Pulmonary:     Effort: Pulmonary effort is normal. No respiratory distress.     Breath sounds: No wheezing, rhonchi or rales.  Musculoskeletal:     Cervical back: Neck supple. No tenderness.  Lymphadenopathy:     Cervical: No cervical adenopathy.  Skin:    Capillary Refill: Capillary refill takes less than 2 seconds.     Coloration: Skin is not jaundiced or pale.     Findings: No rash.  Neurological:     General: No focal deficit present.     Mental Status: She is alert and oriented to person, place, and time.      UC Treatments / Results  Labs (all labs ordered are listed, but only abnormal results are displayed) Labs Reviewed  COVID-19, FLU A+B AND RSV  CULTURE, GROUP A STREP [facility])  POCT RAPID STREP A (OFFICE)    EKG   Radiology No results found.  Procedures Procedures (including critical care time)  Medications Ordered in UC Medications - No data to display  Initial Impression / Assessment and Plan / UC Course  I have reviewed the triage vital signs and the nursing notes.  Pertinent labs & imaging results that were available during my care of the patient were reviewed by me and considered in my medical decision making (see chart for details).     Patient afebrile, nontoxic, with SpO2 97%.  Rapid strep negative, culture pending.  Covid PCR pending.  Patient to quarantine until results are back.  We will treat supportively as outlined below.  Return  precautions discussed, patient verbalized understanding and is agreeable to plan. Final Clinical Impressions(s) / UC Diagnoses   Final diagnoses:  Viral illness  Cough     Discharge Instructions     Your COVID test is pending - it is important to quarantine / isolate at home until your results are back. If  you test positive and would like further evaluation for persistent or worsening symptoms, you may schedule an E-visit or virtual (video) visit throughout the [facility] app or website.  PLEASE NOTE: If you develop severe chest pain or shortness of breath please go to the ER or call 9-1-1 for further evaluation --> DO NOT schedule electronic or virtual visits for this. Please call our office for further guidance / recommendations as needed.  For information about the Covid vaccine, please visit [email or web address]    ED Prescriptions    Medication Sig Dispense Auth. Provider   fluticasone (FLONASE) 50 MCG/ACT nasal spray Place 1 spray into both nostrils daily. 16 g Hall-Potvin, [place], PA-C   benzonatate (TESSALON) 100 MG capsule Take 1 capsule (100 mg total) by mouth every 8 (eight) hours. 21 capsule Hall-Potvin, [place], PA-C   predniSONE (DELTASONE) 50 MG tablet Take 1 tablet (50 mg total) by mouth daily with breakfast. 5 tablet Hall-Potvin, [place], PA-C   fexofenadine (ALLEGRA) 60 MG tablet Take 1 tablet (60 mg total) by mouth 2 (two) times daily. 60 tablet Hall-Potvin, [place], PA-C     PDMP not reviewed this encounter.   Hall-Potvin, [place], [place] 03/22/20 1227

## 2020-03-22 NOTE — Discharge Instructions (Addendum)
Your COVID test is pending - it is important to quarantine / isolate at home until your results are back. °If you test positive and would like further evaluation for persistent or worsening symptoms, you may schedule an E-visit or virtual (video) visit throughout the [facility] app or website. ° °PLEASE NOTE: If you develop severe chest pain or shortness of breath please go to the ER or call 9-1-1 for further evaluation --> DO NOT schedule electronic or virtual visits for this. °Please call our office for further guidance / recommendations as needed. ° °For information about the Covid vaccine, please visit [email or web address] °

## 2020-03-23 LAB — COVID-19, FLU A+B AND RSV
Influenza A, NAA: NOT DETECTED
Influenza B, NAA: NOT DETECTED
RSV, NAA: NOT DETECTED
SARS-CoV-2, NAA: NOT DETECTED

## 2020-03-24 LAB — CULTURE, GROUP A STREP (THRC)

## 2020-04-17 ENCOUNTER — Other Ambulatory Visit: Payer: Self-pay | Admitting: Family Medicine

## 2020-04-17 DIAGNOSIS — M5416 Radiculopathy, lumbar region: Secondary | ICD-10-CM

## 2020-04-17 DIAGNOSIS — M5441 Lumbago with sciatica, right side: Secondary | ICD-10-CM

## 2020-05-23 ENCOUNTER — Other Ambulatory Visit: Payer: Self-pay

## 2020-05-23 ENCOUNTER — Ambulatory Visit
Admission: RE | Admit: 2020-05-23 | Discharge: 2020-05-23 | Disposition: A | Discharge status: Home / Self Care | Payer: PRIVATE HEALTH INSURANCE | Source: Ambulatory Visit | Attending: Family Medicine | Admitting: Family Medicine

## 2020-05-23 DIAGNOSIS — M5441 Lumbago with sciatica, right side: Secondary | ICD-10-CM

## 2020-05-23 DIAGNOSIS — M5416 Radiculopathy, lumbar region: Secondary | ICD-10-CM

## 2020-06-10 ENCOUNTER — Other Ambulatory Visit: Payer: Self-pay
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
## 2021-06-05 ENCOUNTER — Ambulatory Visit: Payer: No Typology Code available for payment source | Admitting: Allergy

## 2021-08-01 ENCOUNTER — Encounter (HOSPITAL_COMMUNITY): Payer: Self-pay | Admitting: Obstetrics and Gynecology

## 2021-08-01 ENCOUNTER — Other Ambulatory Visit: Payer: Self-pay

## 2021-08-01 ENCOUNTER — Inpatient Hospital Stay (HOSPITAL_COMMUNITY)
Admission: AD | Admit: 2021-08-01 | Discharge: 2021-08-01 | Disposition: A | Discharge status: Home / Self Care | Payer: 59 | Attending: Obstetrics and Gynecology | Admitting: Obstetrics and Gynecology

## 2021-08-01 ENCOUNTER — Inpatient Hospital Stay (HOSPITAL_BASED_OUTPATIENT_CLINIC_OR_DEPARTMENT_OTHER): Payer: 59

## 2021-08-01 DIAGNOSIS — Z3689 Encounter for other specified antenatal screening: Secondary | ICD-10-CM

## 2021-08-01 DIAGNOSIS — M7989 Other specified soft tissue disorders: Secondary | ICD-10-CM | POA: Insufficient documentation

## 2021-08-01 DIAGNOSIS — O163 Unspecified maternal hypertension, third trimester: Secondary | ICD-10-CM | POA: Diagnosis not present

## 2021-08-01 DIAGNOSIS — O368130 Decreased fetal movements, third trimester, not applicable or unspecified: Secondary | ICD-10-CM | POA: Diagnosis present

## 2021-08-01 DIAGNOSIS — Z3A35 35 weeks gestation of pregnancy: Secondary | ICD-10-CM

## 2021-08-01 DIAGNOSIS — O368190 Decreased fetal movements, unspecified trimester, not applicable or unspecified: Secondary | ICD-10-CM | POA: Diagnosis not present

## 2021-08-01 DIAGNOSIS — G44209 Tension-type headache, unspecified, not intractable: Secondary | ICD-10-CM | POA: Diagnosis not present

## 2021-08-01 DIAGNOSIS — O26893 Other specified pregnancy related conditions, third trimester: Secondary | ICD-10-CM | POA: Insufficient documentation

## 2021-08-01 DIAGNOSIS — O99113 Other diseases of the blood and blood-forming organs and certain disorders involving the immune mechanism complicating pregnancy, third trimester: Secondary | ICD-10-CM | POA: Insufficient documentation

## 2021-08-01 DIAGNOSIS — O10913 Unspecified pre-existing hypertension complicating pregnancy, third trimester: Secondary | ICD-10-CM | POA: Insufficient documentation

## 2021-08-01 DIAGNOSIS — O99353 Diseases of the nervous system complicating pregnancy, third trimester: Secondary | ICD-10-CM | POA: Insufficient documentation

## 2021-08-01 DIAGNOSIS — D696 Thrombocytopenia, unspecified: Secondary | ICD-10-CM | POA: Insufficient documentation

## 2021-08-01 DIAGNOSIS — O99119 Other diseases of the blood and blood-forming organs and certain disorders involving the immune mechanism complicating pregnancy, unspecified trimester: Secondary | ICD-10-CM

## 2021-08-01 LAB — URINALYSIS, ROUTINE W REFLEX MICROSCOPIC
Bilirubin Urine: NEGATIVE
Glucose, UA: NEGATIVE mg/dL
Hgb urine dipstick: NEGATIVE
Ketones, ur: NEGATIVE mg/dL
Nitrite: NEGATIVE
Protein, ur: NEGATIVE mg/dL
Specific Gravity, Urine: 1.003 — ABNORMAL LOW (ref 1.005–1.030)
pH: 7 (ref 5.0–8.0)

## 2021-08-01 LAB — COMPREHENSIVE METABOLIC PANEL
ALT: 15 U/L (ref 0–44)
AST: 19 U/L (ref 15–41)
Albumin: 3.2 g/dL — ABNORMAL LOW (ref 3.5–5.0)
Alkaline Phosphatase: 145 U/L — ABNORMAL HIGH (ref 38–126)
Anion gap: 8 (ref 5–15)
BUN: 5 mg/dL — ABNORMAL LOW (ref 6–20)
CO2: 24 mmol/L (ref 22–32)
Calcium: 9.4 mg/dL (ref 8.9–10.3)
Chloride: 104 mmol/L (ref 98–111)
Creatinine, Ser: 0.78 mg/dL (ref 0.44–1.00)
GFR, Estimated: >60 mL/min (ref >60)
Glucose, Bld: 75 mg/dL (ref 70–99)
Potassium: 4.2 mmol/L (ref 3.5–5.1)
Sodium: 136 mmol/L (ref 135–145)
Total Bilirubin: 0.4 mg/dL (ref 0.3–1.2)
Total Protein: 6.7 g/dL (ref 6.5–8.1)

## 2021-08-01 LAB — CBC
HCT: 36.5 % (ref 36.0–46.0)
Hemoglobin: 12.3 g/dL (ref 12.0–15.0)
MCH: 30.6 pg (ref 26.0–34.0)
MCHC: 33.7 g/dL (ref 30.0–36.0)
MCV: 90.8 fL (ref 80.0–100.0)
Platelets: 122 10*3/uL — ABNORMAL LOW (ref 150–400)
RBC: 4.02 MIL/uL (ref 3.87–5.11)
RDW: 13 % (ref 11.5–15.5)
WBC: 12.8 10*3/uL — ABNORMAL HIGH (ref 4.0–10.5)
nRBC: 0 % (ref 0.0–0.2)

## 2021-08-01 LAB — PROTEIN / CREATININE RATIO, URINE
Creatinine, Urine: 30.33 mg/dL
Total Protein, Urine: <6 mg/dL

## 2021-08-01 MED ORDER — METOCLOPRAMIDE HCL 10 MG PO TABS
10.0000 mg | ORAL_TABLET | Freq: Once | ORAL | Status: AC
  Administered 2021-08-01: 10 mg via ORAL
  Filled 2021-08-01: qty 1

## 2021-08-01 MED ORDER — METOCLOPRAMIDE HCL 10 MG PO TABS
10.0000 mg | ORAL_TABLET | Freq: Four times a day (QID) | ORAL | 0 refills | Status: DC | PRN
Start: 2021-08-01 — End: 2023-01-25

## 2021-08-01 MED ORDER — DIPHENHYDRAMINE HCL 25 MG PO CAPS
25.0000 mg | ORAL_CAPSULE | Freq: Once | ORAL | Status: AC
  Administered 2021-08-01: 25 mg via ORAL
  Filled 2021-08-01: qty 1

## 2021-08-01 NOTE — MAU Provider Note (Signed)
?History  ?  ? ?CSN: [id number] ? ?Arrival date and time: 08/01/21 1038 ? ?Cindy Boyle is a [age] year old G1 @35 .5 wks presenting with headache with elevated BP this a.m. Had a episode in which she became hot and sweaty, seeing black spots and onset of periorbital headache.  Headache is bilateral, throbbing and she rates it an 8 out of 10.  She has not treated the pain.  Had a coworker check her blood pressure and reports 135/105.  Denies chest pain, shortness of breath, right upper quadrant pain.  Endorses blurry vision in the right eye x2 days, she thought was allergies.  She has history of migraines and states this is not as severe.  Also endorses decreased fetal movement since yesterday afternoon, has felt 1 fm since efm applied.  ? ? ? ?OB History   ? ? Gravida  ?1  ? Para  ?   ? Term  ?   ? Preterm  ?   ? AB  ?   ? Living  ?   ?  ? ? SAB  ?   ? IAB  ?   ? Ectopic  ?   ? Multiple  ?   ? Live Births  ?   ?   ?  ?  ? ? ?Past Medical History:  ?Diagnosis Date  ? Allergy   ? Arthritis   ? Asthma   ? Heart murmur   ? Migraines   ? ? ?Past Surgical History:  ?Procedure Laterality Date  ? NO PAST SURGERIES    ? ? ?Family History  ?Problem Relation Age of Onset  ? Healthy Mother   ? Healthy Father   ? ? ?Social History  ? ?Tobacco Use  ? Smoking status: Never  ? Smokeless tobacco: Never  ?Vaping Use  ? Vaping Use: Never used  ?Substance Use Topics  ? Alcohol use: Not Currently  ? Drug use: No  ? ? ?Allergies:  ?Allergies  ?Allergen Reactions  ? Banana Hives and Itching  ? Cherry Anaphylaxis and Swelling  ? Corn Oil Anaphylaxis  ? Daucus Carota Hives and Itching  ? Other Anaphylaxis  ?  Nuts ?Soy  ? Dust Mite Extract Hives  ? Molds & Smuts Hives  ? Tree Extract Hives  ? Latex Hives  ? ? ?No medications prior to admission.  ? ? ?Review of Systems  ?Eyes:  Positive for visual disturbance.  ?Respiratory:  Negative for shortness of breath.   ?Cardiovascular:  Positive for leg swelling. Negative for chest pain.   ?Gastrointestinal:  Negative for abdominal pain.  ?Neurological:  Positive for headaches.  ?Physical Exam  ? ?Blood pressure (!) 135/94, pulse 97, temperature 98.3 ?F (36.8 ?C), temperature source Oral, resp. rate 18, height 6\' 1"  (1.854 m), weight 108.5 kg, SpO2 100 %. ?Patient Vitals for the past 24 hrs: ? BP Temp Temp src Pulse Resp SpO2 Height Weight  ?08/01/21 1423 -- -- -- -- 18 -- -- --  ?08/01/21 1411 (!) 135/94 -- -- 97 -- -- -- --  ?08/01/21 1401 (!) 137/102 -- -- (!) 101 -- -- -- --  ?08/01/21 1346 123/89 -- -- 78 -- -- -- --  ?08/01/21 1331 111/68 -- -- 78 -- -- -- --  ?08/01/21 1316 116/71 -- -- 79 -- -- -- --  ?08/01/21 1301 140/86 -- -- 86 -- -- -- --  ?08/01/21 1216 (!) 137/92 -- -- 86 -- -- -- --  ?08/01/21 1146 (!) 131/94 -- -- (!) 101 -- -- -- --  ?  08/01/21 1128 127/89 -- -- 93 -- -- -- --  ?08/01/21 1116 139/83 -- -- 94 -- -- -- --  ?08/01/21 1102 133/88 98.3 ?F (36.8 ?C) Oral 97 18 100 % 6\' 1"  (1.854 m) 108.5 kg  ? ? ?Physical Exam ?Vitals and nursing note reviewed.  ?Constitutional:   ?   General: She is not in acute distress. ?   Appearance: Normal appearance.  ?HENT:  ?   Head: Normocephalic and atraumatic.  ?Cardiovascular:  ?   Rate and Rhythm: Normal rate.  ?Pulmonary:  ?   Effort: Pulmonary effort is normal. No respiratory distress.  ?Musculoskeletal:     ?   General: Swelling (1+ BLE) present. Normal range of motion.  ?   Cervical back: Normal range of motion.  ?Skin: ?   General: Skin is warm and dry.  ?Neurological:  ?   General: No focal deficit present.  ?   Mental Status: She is alert and oriented to person, place, and time.  ?Psychiatric:     ?   Mood and Affect: Mood normal.     ?   Behavior: Behavior normal.  ?EFM: 145 bpm, mod variability, + accels, no decels ?Toco: Rare ? ?Results for orders placed or performed during the hospital encounter of 08/01/21 (from the past 24 hour(s))  ?Protein / creatinine ratio, urine     Status: None  ? Collection Time: 08/01/21 11:10 AM  ?Result  Value Ref Range  ? Creatinine, Urine 30.33 mg/dL  ? Total Protein, Urine <6 mg/dL  ? Protein Creatinine Ratio        0.00 - 0.15 mg/mg[Cre]  ?Urinalysis, Routine w reflex microscopic Urine, Clean Catch     Status: Abnormal  ? Collection Time: 08/01/21 11:11 AM  ?Result Value Ref Range  ? Color, Urine STRAW (A) YELLOW  ? APPearance CLEAR CLEAR  ? Specific Gravity, Urine 1.003 (L) 1.005 - 1.030  ? pH 7.0 5.0 - 8.0  ? Glucose, UA NEGATIVE NEGATIVE mg/dL  ? Hgb urine dipstick NEGATIVE NEGATIVE  ? Bilirubin Urine NEGATIVE NEGATIVE  ? Ketones, ur NEGATIVE NEGATIVE mg/dL  ? Protein, ur NEGATIVE NEGATIVE mg/dL  ? Nitrite NEGATIVE NEGATIVE  ? Leukocytes,Ua SMALL (A) NEGATIVE  ? RBC / HPF 0-5 0 - 5 RBC/hpf  ? WBC, UA 0-5 0 - 5 WBC/hpf  ? Bacteria, UA RARE (A) NONE SEEN  ? Squamous Epithelial / LPF 0-5 0 - 5  ?CBC     Status: Abnormal  ? Collection Time: 08/01/21 11:33 AM  ?Result Value Ref Range  ? WBC 12.8 (H) 4.0 - 10.5 K/uL  ? RBC 4.02 3.87 - 5.11 MIL/uL  ? Hemoglobin 12.3 12.0 - 15.0 g/dL  ? HCT 36.5 36.0 - 46.0 %  ? MCV 90.8 80.0 - 100.0 fL  ? MCH 30.6 26.0 - 34.0 pg  ? MCHC 33.7 30.0 - 36.0 g/dL  ? RDW 13.0 11.5 - 15.5 %  ? Platelets 122 (L) 150 - 400 K/uL  ? nRBC 0.0 0.0 - 0.2 %  ?Comprehensive metabolic panel     Status: Abnormal  ? Collection Time: 08/01/21 11:33 AM  ?Result Value Ref Range  ? Sodium 136 135 - 145 mmol/L  ? Potassium 4.2 3.5 - 5.1 mmol/L  ? Chloride 104 98 - 111 mmol/L  ? CO2 24 22 - 32 mmol/L  ? Glucose, Bld 75 70 - 99 mg/dL  ? BUN 5 (L) 6 - 20 mg/dL  ? Creatinine, Ser 0.78 0.44 - 1.00 mg/dL  ?  Calcium 9.4 8.9 - 10.3 mg/dL  ? Total Protein 6.7 6.5 - 8.1 g/dL  ? Albumin 3.2 (L) 3.5 - 5.0 g/dL  ? AST 19 15 - 41 U/L  ? ALT 15 0 - 44 U/L  ? Alkaline Phosphatase 145 (H) 38 - 126 U/L  ? Total Bilirubin 0.4 0.3 - 1.2 mg/dL  ? GFR, Estimated >60 >60 mL/min  ? Anion gap 8 5 - 15  ? ?MAU Course  ?Procedures ?Benadryl ?Reglan ? ?MDM ?Labs ordered and reviewed.  BPP 8 out of 8 with normal AFI, patient feeling  good FM since EFM appointment, NST reactive.  HA improved with meds. Doesn't meet criteria for gHTN at this time but needs close f/u. Has BP cuff at home and will check daily or if has symptoms.  Stable for discharge home. ? ?Assessment and Plan  ? ?1. [redacted] weeks gestation of pregnancy   ?2. Decreased fetal movement   ?3. Hypertension affecting pregnancy in third trimester   ?4. Acute non intractable tension-type headache   ?5. Thrombocytopenia affecting pregnancy (HCC)   ? ?Discharge home ?[facility] at [facility] tomorrow or Monday ?Strict PEC precautions ?Reglan/Benadryl/caffeine as needed headache ?FMCs ? ?Allergies as of 08/01/2021   ? ?   Reactions  ? Banana Hives, Itching  ? Cherry Anaphylaxis, Swelling  ? Corn Oil Anaphylaxis  ? Daucus Carota Hives, Itching  ? Other Anaphylaxis  ? Nuts ?Soy  ? Dust Mite Extract Hives  ? Molds & Smuts Hives  ? Tree Extract Hives  ? Latex Hives  ? ?  ? ?  ?Medication List  ?  ? ?STOP taking these medications   ? ?benzonatate 100 MG capsule ?Commonly known as: TESSALON ?  ?CHOLINE PO ?  ?predniSONE 50 MG tablet ?Commonly known as: DELTASONE ?  ? ?  ? ?TAKE these medications   ? ?fexofenadine 60 MG tablet ?Commonly known as: ALLEGRA ?Take 1 tablet (60 mg total) by mouth 2 (two) times daily. ?  ?fluticasone 50 MCG/ACT nasal spray ?Commonly known as: FLONASE ?Place 1 spray into both nostrils daily. ?  ?loratadine 10 MG tablet ?Commonly known as: CLARITIN ?Take 10 mg by mouth daily. ?  ?metoCLOPramide 10 MG tablet ?Commonly known as: Reglan ?Take 1 tablet (10 mg total) by mouth every 6 (six) hours as needed (HA). ?  ?prenatal multivitamin Tabs tablet ?Take 1 tablet by mouth daily at 12 noon. ?  ?PROTONIX PO ?Take 40 mg by mouth daily. ?  ?WELLBUTRIN XL PO ?Take 150 mg by mouth daily. ?  ? ?  ? ?Donette Larry, CNM ?08/01/2021, 2:35 PM  ?

## 2021-08-01 NOTE — MAU Note (Signed)
Cindy Boyle is a 26 y.o. at 35.5wks. here in MAU reporting: was at work, HA started, an hour later, started feeling hot, started seeing spots, HA became worse, started feeling really weak.  Wasn't sure if it was BP or blood sugar.(Has no hx of issues with either of those) 135/106- checked by coworker that is an EMT. Had something to eat. Realized the baby wasn't moving either ? ?Onset of complaint: this morning ?Pain score: 8/10, not as bad as usual migraines ?Vitals:  ? 08/01/21 1102  ?BP: 133/88  ?Pulse: 97  ?Resp: 18  ?Temp: 98.3 ?F (36.8 ?C)  ?SpO2: 100%  ?   ?FHT:144 ?Lab orders placed from triage:  urine ?

## 2022-02-10 ENCOUNTER — Emergency Department (HOSPITAL_BASED_OUTPATIENT_CLINIC_OR_DEPARTMENT_OTHER)
Admission: EM | Admit: 2022-02-10 | Discharge: 2022-02-10 | Disposition: A | Discharge status: Home / Self Care | Payer: PRIVATE HEALTH INSURANCE | Attending: Emergency Medicine | Admitting: Emergency Medicine

## 2022-02-10 ENCOUNTER — Other Ambulatory Visit: Payer: Self-pay

## 2022-02-10 ENCOUNTER — Emergency Department (HOSPITAL_BASED_OUTPATIENT_CLINIC_OR_DEPARTMENT_OTHER): Payer: PRIVATE HEALTH INSURANCE | Admitting: Radiology

## 2022-02-10 ENCOUNTER — Emergency Department (HOSPITAL_BASED_OUTPATIENT_CLINIC_OR_DEPARTMENT_OTHER): Payer: PRIVATE HEALTH INSURANCE

## 2022-02-10 ENCOUNTER — Encounter (HOSPITAL_BASED_OUTPATIENT_CLINIC_OR_DEPARTMENT_OTHER): Payer: Self-pay | Admitting: Emergency Medicine

## 2022-02-10 DIAGNOSIS — N10 Acute tubulo-interstitial nephritis: Secondary | ICD-10-CM | POA: Diagnosis not present

## 2022-02-10 DIAGNOSIS — R1031 Right lower quadrant pain: Secondary | ICD-10-CM | POA: Insufficient documentation

## 2022-02-10 DIAGNOSIS — Z91040 Latex allergy status: Secondary | ICD-10-CM | POA: Insufficient documentation

## 2022-02-10 DIAGNOSIS — R300 Dysuria: Secondary | ICD-10-CM | POA: Diagnosis present

## 2022-02-10 DIAGNOSIS — Z20822 Contact with and (suspected) exposure to covid-19: Secondary | ICD-10-CM | POA: Diagnosis not present

## 2022-02-10 LAB — COMPREHENSIVE METABOLIC PANEL
ALT: 12 U/L (ref 0–44)
AST: 13 U/L — ABNORMAL LOW (ref 15–41)
Albumin: 4.6 g/dL (ref 3.5–5.0)
Alkaline Phosphatase: 66 U/L (ref 38–126)
Anion gap: 10 (ref 5–15)
BUN: 11 mg/dL (ref 6–20)
CO2: 24 mmol/L (ref 22–32)
Calcium: 9.8 mg/dL (ref 8.9–10.3)
Chloride: 100 mmol/L (ref 98–111)
Creatinine, Ser: 0.83 mg/dL (ref 0.44–1.00)
GFR, Estimated: >60 mL/min (ref >60)
Glucose, Bld: 108 mg/dL — ABNORMAL HIGH (ref 70–99)
Potassium: 3.7 mmol/L (ref 3.5–5.1)
Sodium: 134 mmol/L — ABNORMAL LOW (ref 135–145)
Total Bilirubin: 0.6 mg/dL (ref 0.3–1.2)
Total Protein: 8.3 g/dL — ABNORMAL HIGH (ref 6.5–8.1)

## 2022-02-10 LAB — URINALYSIS, ROUTINE W REFLEX MICROSCOPIC
Bilirubin Urine: NEGATIVE
Glucose, UA: NEGATIVE mg/dL
Ketones, ur: NEGATIVE mg/dL
Nitrite: POSITIVE — AB
Protein, ur: 30 mg/dL — AB
Specific Gravity, Urine: 1.019 (ref 1.005–1.030)
WBC, UA: >50 WBC/hpf — ABNORMAL HIGH (ref 0–5)
pH: 5.5 (ref 5.0–8.0)

## 2022-02-10 LAB — RESP PANEL BY RT-PCR (FLU A&B, COVID) ARPGX2
Influenza A by PCR: NEGATIVE
Influenza B by PCR: NEGATIVE
SARS Coronavirus 2 by RT PCR: NEGATIVE

## 2022-02-10 LAB — CBC WITH DIFFERENTIAL/PLATELET
Abs Immature Granulocytes: 0.06 10*3/uL (ref 0.00–0.07)
Basophils Absolute: 0 10*3/uL (ref 0.0–0.1)
Basophils Relative: 0 %
Eosinophils Absolute: 0 10*3/uL (ref 0.0–0.5)
Eosinophils Relative: 0 %
HCT: 41.3 % (ref 36.0–46.0)
Hemoglobin: 13.5 g/dL (ref 12.0–15.0)
Immature Granulocytes: 0 %
Lymphocytes Relative: 6 %
Lymphs Abs: 0.9 10*3/uL (ref 0.7–4.0)
MCH: 28.9 pg (ref 26.0–34.0)
MCHC: 32.7 g/dL (ref 30.0–36.0)
MCV: 88.4 fL (ref 80.0–100.0)
Monocytes Absolute: 0.9 10*3/uL (ref 0.1–1.0)
Monocytes Relative: 6 %
Neutro Abs: 12.2 10*3/uL — ABNORMAL HIGH (ref 1.7–7.7)
Neutrophils Relative %: 88 %
Platelets: 202 10*3/uL (ref 150–400)
RBC: 4.67 MIL/uL (ref 3.87–5.11)
RDW: 13.1 % (ref 11.5–15.5)
WBC: 14.1 10*3/uL — ABNORMAL HIGH (ref 4.0–10.5)
nRBC: 0 % (ref 0.0–0.2)

## 2022-02-10 LAB — LACTIC ACID, PLASMA: Lactic Acid, Venous: 0.8 mmol/L (ref 0.5–1.9)

## 2022-02-10 LAB — PREGNANCY, URINE: Preg Test, Ur: NEGATIVE

## 2022-02-10 MED ORDER — SODIUM CHLORIDE 0.9 % IV BOLUS
1000.0000 mL | Freq: Once | INTRAVENOUS | Status: AC
Start: 2022-02-10 — End: 2022-02-10
  Administered 2022-02-10: 1000 mL via INTRAVENOUS

## 2022-02-10 MED ORDER — CEFDINIR 300 MG PO CAPS: 300.0000 mg | ORAL_CAPSULE | Freq: Two times a day (BID) | ORAL | 0 refills | Status: AC

## 2022-02-10 MED ORDER — ONDANSETRON 4 MG PO TBDP
ORAL_TABLET | ORAL | 0 refills | Status: DC
Start: 2022-02-10 — End: 2022-02-10

## 2022-02-10 MED ORDER — SODIUM CHLORIDE 0.9 % IV SOLN
2.0000 g | Freq: Once | INTRAVENOUS | Status: AC
  Administered 2022-02-10: 2 g via INTRAVENOUS
  Filled 2022-02-10: qty 20

## 2022-02-10 MED ORDER — ONDANSETRON 4 MG PO TBDP: ORAL_TABLET | ORAL | 0 refills | Status: DC

## 2022-02-10 MED ORDER — KETOROLAC TROMETHAMINE 15 MG/ML IJ SOLN
15.0000 mg | Freq: Once | INTRAMUSCULAR | Status: AC
  Administered 2022-02-10: 15 mg via INTRAVENOUS
  Filled 2022-02-10: qty 1

## 2022-02-10 MED ORDER — CEFDINIR 300 MG PO CAPS: 300.0000 mg | ORAL_CAPSULE | Freq: Two times a day (BID) | ORAL | 0 refills | Status: DC

## 2022-02-10 NOTE — ED Provider Notes (Signed)
[facility] EMERGENCY DEPT Provider Note   CSN: [id number] Arrival date & time: 02/10/22  1511     History  Chief Complaint  Patient presents with   Abdominal Pain    Cindy Boyle is a 26 y.o. female.  26 yo F with a chief complaints of dysuria and flank pain and fever.  This been going on for about 3 to 4 days now.  She thinks it feels similar to when she had kidney infection in the past.  Started feeling worse today and so came to the emergency department for evaluation.  Has been able to eat and drink without issue.  Has an intolerance to Tylenol and so has not been taking that at home.  No nausea no vomiting.  She is having some right-sided abdominal pain.  Low back pain with this.   Abdominal Pain      Home Medications Prior to Admission medications   Medication Sig Start Date End Date Taking? Authorizing Provider  buPROPion HCl (WELLBUTRIN XL PO) Take 150 mg by mouth daily.    [provider]  cefdinir (OMNICEF) 300 MG capsule Take 1 capsule (300 mg total) by mouth 2 (two) times daily for 7 days. 02/10/22 02/17/22  Melene Plan, DO  fexofenadine (ALLEGRA) 60 MG tablet Take 1 tablet (60 mg total) by mouth 2 (two) times daily. 03/22/20   Hall-Potvin, [place], PA-C  fluticasone (FLONASE) 50 MCG/ACT nasal spray Place 1 spray into both nostrils daily. 03/22/20   Hall-Potvin, [place], PA-C  loratadine (CLARITIN) 10 MG tablet Take 10 mg by mouth daily.    [provider]  metoCLOPramide (REGLAN) 10 MG tablet Take 1 tablet (10 mg total) by mouth every 6 (six) hours as needed (HA). 08/01/21   Donette Larry, CNM  ondansetron (ZOFRAN-ODT) 4 MG disintegrating tablet 4mg  ODT q4 hours prn nausea/vomit 02/10/22   02/12/22, DO  Pantoprazole Sodium (PROTONIX PO) Take 40 mg by mouth daily.    [provider]  Prenatal Vit-Fe Fumarate-FA (PRENATAL MULTIVITAMIN) TABS tablet Take 1 tablet by mouth daily at 12 noon.    [provider]       Allergies    Banana, Cherry, Corn oil, Daucus carota, Other, Dust mite extract, Molds & smuts, Tree extract, Acetaminophen, and Latex    Review of Systems   Review of Systems  Gastrointestinal:  Positive for abdominal pain.    Physical Exam Updated Vital Signs BP 127/83   Pulse (!) 107   Temp 99.7 F (37.6 C)   Resp 16   Ht 6\' 1"  (1.854 m)   Wt 111.1 kg   SpO2 96%   Breastfeeding Yes   BMI 32.32 kg/m  Physical Exam Vitals and nursing note reviewed.  Constitutional:      General: She is not in acute distress.    Appearance: She is well-developed. She is not diaphoretic.  HENT:     Head: Normocephalic and atraumatic.  Eyes:     Pupils: Pupils are equal, round, and reactive to light.  Cardiovascular:     Rate and Rhythm: Normal rate and regular rhythm.     Heart sounds: No murmur heard.    No friction rub. No gallop.  Pulmonary:     Effort: Pulmonary effort is normal.     Breath sounds: No wheezing or rales.  Abdominal:     General: There is no distension.     Palpations: Abdomen is soft.     Tenderness: There is abdominal tenderness in the right  lower quadrant.     Comments: Pain worse to the right lower quadrant with some guarding.  No obvious CVA tenderness.  Musculoskeletal:        General: No tenderness.     Cervical back: Normal range of motion and neck supple.  Skin:    General: Skin is warm and dry.  Neurological:     Mental Status: She is alert and oriented to person, place, and time.  Psychiatric:        Behavior: Behavior normal.     ED Results / Procedures / Treatments   Labs (all labs ordered are listed, but only abnormal results are displayed) Labs Reviewed  COMPREHENSIVE METABOLIC PANEL - Abnormal; Notable for the following components:      Result Value   Sodium 134 (*)    Glucose, Bld 108 (*)    Total Protein 8.3 (*)    AST 13 (*)    All other components within normal limits  CBC WITH DIFFERENTIAL/PLATELET - Abnormal; Notable for the  following components:   WBC 14.1 (*)    Neutro Abs 12.2 (*)    All other components within normal limits  URINALYSIS, ROUTINE W REFLEX MICROSCOPIC - Abnormal; Notable for the following components:   APPearance CLOUDY (*)    Hgb urine dipstick MODERATE (*)    Protein, ur 30 (*)    Nitrite POSITIVE (*)    Leukocytes,Ua LARGE (*)    WBC, UA >50 (*)    Bacteria, UA FEW (*)    Non Squamous Epithelial 0-5 (*)    All other components within normal limits  RESP PANEL BY RT-PCR (FLU A&B, COVID) ARPGX2  LACTIC ACID, PLASMA  PREGNANCY, URINE    EKG None  Radiology CT Renal Stone Study  Result Date: 02/10/2022 CLINICAL DATA:  Flank pain.  Rule out kidney stone. EXAM: CT ABDOMEN AND PELVIS WITHOUT CONTRAST TECHNIQUE: Multidetector CT imaging of the abdomen and pelvis was performed following the standard protocol without IV contrast. RADIATION DOSE REDUCTION: This exam was performed according to the departmental dose-optimization program which includes automated exposure control, adjustment of the mA and/or kV according to patient size and/or use of iterative reconstruction technique. COMPARISON:  None Available. FINDINGS: Lower chest: No acute abnormality. Hepatobiliary: No focal liver abnormality is seen. No gallstones, gallbladder wall thickening, or biliary dilatation. Pancreas: Unremarkable. No pancreatic ductal dilatation or surrounding inflammatory changes. Spleen: Normal in size without focal abnormality. Adrenals/Urinary Tract: Normal adrenal glands. No nephrolithiasis, hydronephrosis or mass identified. The no signs hydroureter or ureteral lithiasis. Urinary bladder is unremarkable. Stomach/Bowel: Stomach appears within normal limits. The appendix is visualized and appears upper limits of normal measuring 8 mm in thickness, image 52/5. No significant periappendiceal fat stranding or free fluid. No bowel wall thickening, inflammation, or distension. Vascular/Lymphatic: Normal appearance of the  abdominal aorta. Prominent retroperitoneal and mesenteric lymph nodes are identified. Prominent, clustered right ileocolic lymph nodes are noted measuring up to 8 mm short axis, image 44/2. No adenopathy identified within the abdomen or pelvis. Reproductive: Uterus and bilateral adnexa are unremarkable. Other: No free fluid or fluid collections. Musculoskeletal: No acute or significant osseous findings. IMPRESSION: 1. No acute findings within the abdomen or pelvis. No signs of nephrolithiasis or obstructive uropathy. 2. The appendix is visualized and appears upper limits of normal in thickness measuring 8 mm in thickness. No significant periappendiceal fat stranding or free fluid. 3. Prominent, clustered right ileocolic lymph nodes are noted. This is nonspecific but may reflect reactive changes. Electronically  Signed   By: Signa Kell M.D.   On: 02/10/2022 19:08   DG Chest 2 View  Result Date: 02/10/2022 CLINICAL DATA:  Fever EXAM: CHEST - 2 VIEW COMPARISON:  None Available. FINDINGS: The heart size and mediastinal contours are within normal limits. Both lungs are clear. The visualized skeletal structures are unremarkable. IMPRESSION: Lungs are clear. Electronically Signed   By: Allegra Lai M.D.   On: 02/10/2022 16:32    Procedures Procedures    Medications Ordered in ED Medications  cefTRIAXone (ROCEPHIN) 2 g in sodium chloride 0.9 % 100 mL IVPB (0 g Intravenous Stopped 02/10/22 1950)  ketorolac (TORADOL) 15 MG/ML injection 15 mg (15 mg Intravenous Given 02/10/22 1804)  sodium chloride 0.9 % bolus 1,000 mL (0 mLs Intravenous Stopped 02/10/22 1950)    ED Course/ Medical Decision Making/ A&P                           Medical Decision Making Amount and/or Complexity of Data Reviewed Labs: ordered. Radiology: ordered.  Risk Prescription drug management.   26 yo F with a chief complaints of dysuria flank pain and fever.  This been going on for a few days now.  She is well-appearing and  nontoxic.  She does have right lower quadrant abdominal pain on exam.  Her urine is also not obviously infected, it is contaminated which limits the ability to declare that is the diagnosis.  We will obtain a CT stone study to assess for possible infected stone versus appendicitis.  Give a dose of Rocephin here.  Toradol IV fluids reassess.  Patient feeling a bit better on reassessment.  Heart rate is improved significantly.  CT scan with no kidney stone.  The appendix was read as normal although there was a comment that was at the upper limits of normal.  I discussed this with the patient.  Return precautions were given.  Will start on oral antibiotics.  8:09 PM:  I have discussed the diagnosis/risks/treatment options with the patient and family.  Evaluation and diagnostic testing in the emergency department does not suggest an emergent condition requiring admission or immediate intervention beyond what has been performed at this time.  They will follow up with  PCP. We also discussed returning to the ED immediately if new or worsening sx occur. We discussed the sx which are most concerning (e.g., sudden worsening pain, fever, inability to tolerate by mouth) that necessitate immediate return. Medications administered to the patient during their visit and any new prescriptions provided to the patient are listed below.  Medications given during this visit Medications  cefTRIAXone (ROCEPHIN) 2 g in sodium chloride 0.9 % 100 mL IVPB (0 g Intravenous Stopped 02/10/22 1950)  ketorolac (TORADOL) 15 MG/ML injection 15 mg (15 mg Intravenous Given 02/10/22 1804)  sodium chloride 0.9 % bolus 1,000 mL (0 mLs Intravenous Stopped 02/10/22 1950)     The patient appears reasonably screen and/or stabilized for discharge and I doubt any other medical condition or other [facility] requiring further screening, evaluation, or treatment in the ED at this time prior to discharge.          Final Clinical Impression(s) / ED  Diagnoses Final diagnoses:  Acute pyelonephritis    Rx / DC Orders ED Discharge Orders          Ordered    cefdinir (OMNICEF) 300 MG capsule  2 times daily,   Status:  Discontinued  02/10/22 1932    ondansetron (ZOFRAN-ODT) 4 MG disintegrating tablet  Status:  Discontinued        02/10/22 1932    ondansetron (ZOFRAN-ODT) 4 MG disintegrating tablet        02/10/22 1958    cefdinir (OMNICEF) 300 MG capsule  2 times daily        02/10/22 1958              Melene Plan, DO 02/10/22 2009

## 2022-02-10 NOTE — ED Triage Notes (Signed)
UTI symptoms Back pain, chills, fevers t 101.4 at home, abdominal/pelvic pain, ha Seen by UC referred to ED

## 2022-02-10 NOTE — Discharge Instructions (Signed)
I will treat you for a kidney infection.  Your appendix was a technically normal on, but was at the higher end in size.  If your pain worsens or if you cannot keep anything down then you should return to the emergency department for evaluation.

## 2022-02-10 NOTE — ED Notes (Signed)
Patient transported to CT 

## 2022-02-12 ENCOUNTER — Emergency Department (HOSPITAL_BASED_OUTPATIENT_CLINIC_OR_DEPARTMENT_OTHER)
Admission: EM | Admit: 2022-02-12 | Discharge: 2022-02-13 | Disposition: A | Discharge status: Home / Self Care | Payer: 59 | Attending: Emergency Medicine | Admitting: Emergency Medicine

## 2022-02-12 ENCOUNTER — Emergency Department (HOSPITAL_BASED_OUTPATIENT_CLINIC_OR_DEPARTMENT_OTHER): Payer: 59

## 2022-02-12 ENCOUNTER — Other Ambulatory Visit: Payer: Self-pay

## 2022-02-12 DIAGNOSIS — I880 Nonspecific mesenteric lymphadenitis: Secondary | ICD-10-CM | POA: Diagnosis not present

## 2022-02-12 DIAGNOSIS — Z91040 Latex allergy status: Secondary | ICD-10-CM | POA: Insufficient documentation

## 2022-02-12 DIAGNOSIS — R1030 Lower abdominal pain, unspecified: Secondary | ICD-10-CM | POA: Diagnosis present

## 2022-02-12 LAB — COMPREHENSIVE METABOLIC PANEL
ALT: 13 U/L (ref 0–44)
AST: 21 U/L (ref 15–41)
Albumin: 4.5 g/dL (ref 3.5–5.0)
Alkaline Phosphatase: 71 U/L (ref 38–126)
Anion gap: 15 (ref 5–15)
BUN: 11 mg/dL (ref 6–20)
CO2: 21 mmol/L — ABNORMAL LOW (ref 22–32)
Calcium: 10 mg/dL (ref 8.9–10.3)
Chloride: 102 mmol/L (ref 98–111)
Creatinine, Ser: 0.81 mg/dL (ref 0.44–1.00)
GFR, Estimated: >60 mL/min (ref >60)
Glucose, Bld: 107 mg/dL — ABNORMAL HIGH (ref 70–99)
Potassium: 4.1 mmol/L (ref 3.5–5.1)
Sodium: 138 mmol/L (ref 135–145)
Total Bilirubin: 0.4 mg/dL (ref 0.3–1.2)
Total Protein: 8.5 g/dL — ABNORMAL HIGH (ref 6.5–8.1)

## 2022-02-12 LAB — CBC
HCT: 40.6 % (ref 36.0–46.0)
Hemoglobin: 13.6 g/dL (ref 12.0–15.0)
MCH: 29.2 pg (ref 26.0–34.0)
MCHC: 33.5 g/dL (ref 30.0–36.0)
MCV: 87.3 fL (ref 80.0–100.0)
Platelets: 215 10*3/uL (ref 150–400)
RBC: 4.65 MIL/uL (ref 3.87–5.11)
RDW: 12.8 % (ref 11.5–15.5)
WBC: 7.9 10*3/uL (ref 4.0–10.5)
nRBC: 0 % (ref 0.0–0.2)

## 2022-02-12 LAB — LIPASE, BLOOD: Lipase: 12 U/L (ref 11–51)

## 2022-02-12 MED ORDER — IOHEXOL 300 MG/ML  SOLN
100.0000 mL | Freq: Once | INTRAMUSCULAR | Status: AC | PRN
  Administered 2022-02-12: 80 mL via INTRAVENOUS

## 2022-02-12 MED ORDER — IBUPROFEN 600 MG PO TABS: 600.0000 mg | ORAL_TABLET | Freq: Four times a day (QID) | ORAL | 0 refills | Status: DC | PRN

## 2022-02-12 MED ORDER — HYDROMORPHONE HCL 1 MG/ML IJ SOLN
1.0000 mg | Freq: Once | INTRAMUSCULAR | Status: AC
  Administered 2022-02-12: 1 mg via INTRAVENOUS
  Filled 2022-02-12: qty 1

## 2022-02-12 MED ORDER — OXYCODONE-ACETAMINOPHEN 5-325 MG PO TABS
1.0000 | ORAL_TABLET | Freq: Once | ORAL | Status: AC
  Administered 2022-02-12: 1 via ORAL
  Filled 2022-02-12: qty 1

## 2022-02-12 MED ORDER — OXYCODONE-ACETAMINOPHEN 5-325 MG PO TABS: 1.0000 | ORAL_TABLET | Freq: Three times a day (TID) | ORAL | 0 refills | Status: DC | PRN

## 2022-02-12 MED ORDER — KETOROLAC TROMETHAMINE 30 MG/ML IJ SOLN
15.0000 mg | Freq: Once | INTRAMUSCULAR | Status: AC
  Administered 2022-02-12: 15 mg via INTRAVENOUS
  Filled 2022-02-12: qty 1

## 2022-02-12 MED ORDER — ONDANSETRON HCL 4 MG/2ML IJ SOLN
4.0000 mg | Freq: Once | INTRAMUSCULAR | Status: AC
  Administered 2022-02-12: 4 mg via INTRAVENOUS
  Filled 2022-02-12: qty 2

## 2022-02-12 NOTE — ED Triage Notes (Signed)
POV, pt sts that she has had increased pain in lower abdomen since CT scan on 9/25, questionable inflammation in appendix on CT scan and sts pain has just gotten worse. Pt crying in pain in triage, BIB wheelchair. Alert and oriented x 4.

## 2022-02-12 NOTE — ED Notes (Signed)
Patient placed on Lilly following pain med administration. SpO2 currently 100%.

## 2022-02-12 NOTE — ED Notes (Signed)
Patient transported to CT 

## 2022-02-12 NOTE — Discharge Instructions (Signed)
Take ibuprofen every 6 hours.  You can take Percocet only if your pain is excruciating.  Please be aware, that any medication including Percocet will pass into your milk.  Typically the reaction is not severe onto the baby, but you can pump and dump the milk if concerned about any ill effects to your baby.  Please return to the ER if your symptoms worsen; you have increased pain, fevers, chills, inability to keep any medications down, confusion. Otherwise see the outpatient doctor as requested.

## 2022-02-12 NOTE — ED Provider Notes (Signed)
[facility] Provider Note   CSN: [id number] Arrival date & time: 02/12/22  1957     History {Add pertinent medical, surgical, social history, OB history to HPI:1} Chief Complaint  Patient presents with  . Abdominal Pain    Cindy Boyle is a 26 y.o. female.  HPI     Home Medications Prior to Admission medications   Medication Sig Start Date End Date Taking? Authorizing Provider  buPROPion HCl (WELLBUTRIN XL PO) Take 150 mg by mouth daily.    [provider]  cefdinir (OMNICEF) 300 MG capsule Take 1 capsule (300 mg total) by mouth 2 (two) times daily for 7 days. 02/10/22 02/17/22  Deno Etienne, DO  fexofenadine (ALLEGRA) 60 MG tablet Take 1 tablet (60 mg total) by mouth 2 (two) times daily. 03/22/20   Hall-Potvin, [place], PA-C  fluticasone (FLONASE) 50 MCG/ACT nasal spray Place 1 spray into both nostrils daily. 03/22/20   Hall-Potvin, [place], PA-C  loratadine (CLARITIN) 10 MG tablet Take 10 mg by mouth daily.    [provider]  metoCLOPramide (REGLAN) 10 MG tablet Take 1 tablet (10 mg total) by mouth every 6 (six) hours as needed (HA). 08/01/21   Julianne Handler, CNM  ondansetron (ZOFRAN-ODT) 4 MG disintegrating tablet 4mg  ODT q4 hours prn nausea/vomit 02/10/22   Deno Etienne, DO  Pantoprazole Sodium (PROTONIX PO) Take 40 mg by mouth daily.    [provider]  Prenatal Vit-Fe Fumarate-FA (PRENATAL MULTIVITAMIN) TABS tablet Take 1 tablet by mouth daily at 12 noon.    [provider]      Allergies    Banana, Cherry, Corn oil, Daucus carota, Other, Dust mite extract, Molds & smuts, Tree extract, Acetaminophen, and Latex    Review of Systems   Review of Systems  Physical Exam Updated Vital Signs BP 134/87   Pulse 89   Temp 97.8 F (36.6 C)   Resp 18   Ht 6\' 1"  (1.854 m)   Wt 111.1 kg   SpO2 100%   Breastfeeding Yes   BMI 32.31 kg/m  Physical Exam  ED Results / Procedures / Treatments   Labs (all labs  ordered are listed, but only abnormal results are displayed) Labs Reviewed  COMPREHENSIVE METABOLIC PANEL - Abnormal; Notable for the following components:      Result Value   CO2 21 (*)    Glucose, Bld 107 (*)    Total Protein 8.5 (*)    All other components within normal limits  LIPASE, BLOOD  CBC  URINALYSIS, ROUTINE W REFLEX MICROSCOPIC  PREGNANCY, URINE    EKG None  Radiology CT ABDOMEN PELVIS W CONTRAST  Result Date: 02/12/2022 CLINICAL DATA:  Right lower quadrant pain. EXAM: CT ABDOMEN AND PELVIS WITH CONTRAST TECHNIQUE: Multidetector CT imaging of the abdomen and pelvis was performed using the standard protocol following bolus administration of intravenous contrast. RADIATION DOSE REDUCTION: This exam was performed according to the departmental dose-optimization program which includes automated exposure control, adjustment of the mA and/or kV according to patient size and/or use of iterative reconstruction technique. CONTRAST:  33mL OMNIPAQUE IOHEXOL 300 MG/ML  SOLN COMPARISON:  February 10, 2022 FINDINGS: Lower chest: No acute abnormality. Hepatobiliary: A 6 mm focus of parenchymal low attenuation is seen within the right lobe of the liver. This is too small to characterize by CT examination (axial CT image 26, CT series 2). No gallstones, gallbladder wall thickening, or biliary dilatation. Pancreas: Unremarkable. No pancreatic ductal dilatation or surrounding inflammatory changes. Spleen: Normal  in size without focal abnormality. Adrenals/Urinary Tract: Adrenal glands are unremarkable. Kidneys are normal, without renal calculi, focal lesion, or hydronephrosis. Bladder is unremarkable. Stomach/Bowel: Stomach is within normal limits. Appendix appears normal. No evidence of bowel wall thickening, distention, or inflammatory changes. Vascular/Lymphatic: No significant vascular findings are present. Multiple subcentimeter mesenteric lymph nodes are seen within the right lower quadrant. A  cluster of subcentimeter para-aortic lymph nodes are seen at the level of the left kidney. Reproductive: Uterus and bilateral adnexa are unremarkable. Other: No abdominal wall hernia or abnormality. No abdominopelvic ascites. Musculoskeletal: No acute or significant osseous findings. IMPRESSION: 1. Multiple subcentimeter mesenteric lymph nodes within the right lower quadrant which may represent mesenteric adenitis. 2. Findings likely consistent with a small hepatic cyst versus hemangioma. Correlation with nonemergent hepatic ultrasound is recommended. Electronically Signed   By: Virgina Norfolk M.D.   On: 02/12/2022 22:00    Procedures Procedures  {Document cardiac monitor, telemetry assessment procedure when appropriate:1}  Medications Ordered in ED Medications  ketorolac (TORADOL) 30 MG/ML injection 15 mg (has no administration in time range)  HYDROmorphone (DILAUDID) injection 1 mg (1 mg Intravenous Given 02/12/22 2051)  ondansetron (ZOFRAN) injection 4 mg (4 mg Intravenous Given 02/12/22 2050)  iohexol (OMNIPAQUE) 300 MG/ML solution 100 mL (80 mLs Intravenous Contrast Given 02/12/22 2143)    ED Course/ Medical Decision Making/ A&P                           Medical Decision Making Amount and/or Complexity of Data Reviewed Labs: ordered. Radiology: ordered.  Risk Prescription drug management.   ***  {Document critical care time when appropriate:1} {Document review of labs and clinical decision tools ie heart score, Chads2Vasc2 etc:1}  {Document your independent review of radiology images, and any outside records:1} {Document your discussion with family members, caretakers, and with consultants:1} {Document social determinants of health affecting pt's care:1} {Document your decision making why or why not admission, treatments were needed:1} Final Clinical Impression(s) / ED Diagnoses Final diagnoses:  None    Rx / DC Orders ED Discharge Orders     None

## 2022-02-12 NOTE — ED Notes (Signed)
Pt. Weakness and pain increasing. Waiting for MD to sign up for pt at this time.

## 2022-05-29 ENCOUNTER — Ambulatory Visit
Admission: EM | Admit: 2022-05-29 | Discharge: 2022-05-29 | Disposition: A | Discharge status: Home / Self Care | Payer: BC Managed Care – PPO | Attending: Nurse Practitioner | Admitting: Nurse Practitioner

## 2022-05-29 DIAGNOSIS — N390 Urinary tract infection, site not specified: Secondary | ICD-10-CM | POA: Diagnosis not present

## 2022-05-29 DIAGNOSIS — H1033 Unspecified acute conjunctivitis, bilateral: Secondary | ICD-10-CM | POA: Diagnosis not present

## 2022-05-29 LAB — POCT URINALYSIS DIP (MANUAL ENTRY)
Bilirubin, UA: NEGATIVE
Glucose, UA: NEGATIVE mg/dL
Ketones, POC UA: NEGATIVE mg/dL
Nitrite, UA: NEGATIVE
Protein Ur, POC: NEGATIVE mg/dL
Spec Grav, UA: 1.01 (ref 1.010–1.025)
Urobilinogen, UA: 0.2 E.U./dL
pH, UA: 6 (ref 5.0–8.0)

## 2022-05-29 MED ORDER — CEPHALEXIN 500 MG PO CAPS: 500.0000 mg | ORAL_CAPSULE | Freq: Four times a day (QID) | ORAL | 0 refills | Status: DC

## 2022-05-29 MED ORDER — TOBRAMYCIN 0.3 % OP SOLN: 1.0000 [drp] | OPHTHALMIC | 0 refills | Status: DC

## 2022-05-29 NOTE — ED Triage Notes (Signed)
Pt reports increase urinary frequency x 1 week. Pt has not tried any meds for complaint.   Pt reports itchiness, redness ad drainage in eyes 2-3 days.

## 2022-05-29 NOTE — Discharge Instructions (Signed)
Follow up with your Physician for recheck  

## 2022-05-29 NOTE — ED Provider Notes (Signed)
[facility]    CSN: [id number] Arrival date & time: 05/29/22  1248      History   Chief Complaint Chief Complaint  Patient presents with   Urinary Frequency    Also possible pink eye - Entered by patient    HPI Cindy Boyle is a 27 y.o. female.   Patient complains of bilateral eye redness and drainage.  Patient reports that she has had redness for several days this a.m. she woke up and both eyes were crusted.  Patient reports she is also been having urinary tract infection symptoms for the past 3 days.  Patient reports her gynecologist has told her that she may be allergic to her husband and this may cause her to get frequent urinary tract infections.  The history is provided by the patient. No language interpreter was used.  Urinary Frequency    Past Medical History:  Diagnosis Date   Allergy    Arthritis    Asthma    Heart murmur    Migraines     Patient Active Problem List   Diagnosis Date Noted   Mild concussion 12/23/2016    Past Surgical History:  Procedure Laterality Date   NO PAST SURGERIES      OB History     Gravida  1   Para      Term      Preterm      AB      Living         SAB      IAB      Ectopic      Multiple      Live Births               Home Medications    Prior to Admission medications   Medication Sig Start Date End Date Taking? Authorizing Provider  cephALEXin (KEFLEX) 500 MG capsule Take 1 capsule (500 mg total) by mouth 4 (four) times daily for 10 days. 05/29/22 06/08/22 Yes Caryl Ada K, PA-C  tobramycin (TOBREX) 0.3 % ophthalmic solution Place 1 drop into both eyes every 4 (four) hours for 10 days. 05/29/22 06/08/22 Yes Fransico Meadow, PA-C  buPROPion HCl (WELLBUTRIN XL PO) Take 150 mg by mouth daily.    [provider]  fexofenadine (ALLEGRA) 60 MG tablet Take 1 tablet (60 mg total) by mouth 2 (two) times daily. 03/22/20   Hall-Potvin, [place], PA-C  fluticasone (FLONASE) 50 MCG/ACT  nasal spray Place 1 spray into both nostrils daily. 03/22/20   Hall-Potvin, [place], PA-C  ibuprofen (ADVIL) 600 MG tablet Take 1 tablet (600 mg total) by mouth every 6 (six) hours as needed. 02/12/22   Varney Biles, MD  loratadine (CLARITIN) 10 MG tablet Take 10 mg by mouth daily.    [provider]  metoCLOPramide (REGLAN) 10 MG tablet Take 1 tablet (10 mg total) by mouth every 6 (six) hours as needed (HA). 08/01/21   Julianne Handler, CNM  ondansetron (ZOFRAN-ODT) 4 MG disintegrating tablet 4mg  ODT q4 hours prn nausea/vomit 02/10/22   Deno Etienne, DO  oxyCODONE-acetaminophen (PERCOCET/ROXICET) 5-325 MG tablet Take 1 tablet by mouth every 8 (eight) hours as needed for severe pain. 02/12/22   Varney Biles, MD  Pantoprazole Sodium (PROTONIX PO) Take 40 mg by mouth daily.    [provider]  Prenatal Vit-Fe Fumarate-FA (PRENATAL MULTIVITAMIN) TABS tablet Take 1 tablet by mouth daily at 12 noon.    [provider]    Family History Family History  Problem Relation Age of Onset   Healthy Mother    Healthy Father     Social History Social History   Tobacco Use   Smoking status: Never   Smokeless tobacco: Never  Vaping Use   Vaping Use: Never used  Substance Use Topics   Alcohol use: Not Currently   Drug use: No     Allergies   Banana, Cherry, Corn oil, Daucus carota, Other, Dust mite extract, Molds & smuts, Tree extract, Acetaminophen, and Latex   Review of Systems Review of Systems  Eyes:  Positive for redness.  Genitourinary:  Positive for frequency.  All other systems reviewed and are negative.    Physical Exam Triage Vital Signs ED Triage Vitals  Enc Vitals Group     BP 05/29/22 1306 (!) 148/85     Pulse Rate 05/29/22 1306 79     Resp 05/29/22 1306 15     Temp 05/29/22 1306 97.8 F (36.6 C)     Temp Source 05/29/22 1306 Oral     SpO2 05/29/22 1306 98 %     Weight --      Height --      Head Circumference --      Peak Flow --       Pain Score 05/29/22 1307 0     Pain Loc --      Pain Edu? --      Excl. in [place]? --    No data found.  Updated Vital Signs BP (!) 148/85 (BP Location: Right Arm)   Pulse 79   Temp 97.8 F (36.6 C) (Oral)   Resp 15   LMP 05/06/2022 (Exact Date)   SpO2 98%   Visual Acuity Right Eye Distance:   Left Eye Distance:   Bilateral Distance:    Right Eye Near:   Left Eye Near:    Bilateral Near:     Physical Exam Vitals and nursing note reviewed.  Constitutional:      Appearance: She is well-developed.  HENT:     Head: Normocephalic.  Eyes:     General:        Right eye: Discharge present.        Left eye: Discharge present. Cardiovascular:     Rate and Rhythm: Normal rate.  Pulmonary:     Effort: Pulmonary effort is normal.  Abdominal:     General: There is no distension.  Musculoskeletal:        General: Normal range of motion.     Cervical back: Normal range of motion.  Skin:    General: Skin is warm.  Neurological:     General: No focal deficit present.     Mental Status: She is alert and oriented to person, place, and time.      UC Treatments / Results  Labs (all labs ordered are listed, but only abnormal results are displayed) Labs Reviewed  POCT URINALYSIS DIP (MANUAL ENTRY) - Abnormal; Notable for the following components:      Result Value   Clarity, UA hazy (*)    Blood, UA trace-intact (*)    Leukocytes, UA Moderate (2+) (*)    All other components within normal limits    EKG   Radiology No results found.  Procedures Procedures (including critical care time)  Medications Ordered in UC Medications - No data to display  Initial Impression / Assessment and Plan / UC Course  I have reviewed the triage vital signs and the nursing notes.  Pertinent labs &  imaging results that were available during my care of the patient were reviewed by me and considered in my medical decision making (see chart for details).     Given prescription for  Tobrex UA shows leukocytes patient is given a prescription for Keflex she is advised to follow-up with her primary care physician for recheck Final Clinical Impressions(s) / UC Diagnoses   Final diagnoses:  Urinary tract infection without hematuria, site unspecified  Acute conjunctivitis of both eyes, unspecified acute conjunctivitis type     Discharge Instructions      Follow up with your Physician for recheck     ED Prescriptions     Medication Sig Dispense Auth. Provider   cephALEXin (KEFLEX) 500 MG capsule Take 1 capsule (500 mg total) by mouth 4 (four) times daily for 10 days. 28 capsule Nani Ingram K, PA-C   tobramycin (TOBREX) 0.3 % ophthalmic solution Place 1 drop into both eyes every 4 (four) hours for 10 days. 5 mL Fransico Meadow, [place]      PDMP not reviewed this encounter. An After Visit Summary was printed and given to the patient.       Fransico Meadow, [place] 05/29/22 1455

## 2022-06-04 ENCOUNTER — Ambulatory Visit
Admission: EM | Admit: 2022-06-04 | Discharge: 2022-06-04 | Disposition: A | Discharge status: Home / Self Care | Payer: BC Managed Care – PPO | Attending: Physician Assistant | Admitting: Physician Assistant

## 2022-06-04 ENCOUNTER — Telehealth: Payer: Self-pay

## 2022-06-04 DIAGNOSIS — H1032 Unspecified acute conjunctivitis, left eye: Secondary | ICD-10-CM | POA: Diagnosis not present

## 2022-06-04 DIAGNOSIS — H02846 Edema of left eye, unspecified eyelid: Secondary | ICD-10-CM

## 2022-06-04 MED ORDER — OFLOXACIN 0.3 % OP SOLN: 1.0000 [drp] | Freq: Four times a day (QID) | OPHTHALMIC | 0 refills | Status: AC

## 2022-06-04 MED ORDER — OFLOXACIN 0.3 % OP SOLN: 1.0000 [drp] | Freq: Four times a day (QID) | OPHTHALMIC | 0 refills | Status: DC

## 2022-06-04 MED ORDER — AMOXICILLIN-POT CLAVULANATE 875-125 MG PO TABS: 1.0000 | ORAL_TABLET | Freq: Two times a day (BID) | ORAL | 0 refills | Status: DC

## 2022-06-04 NOTE — ED Triage Notes (Signed)
Pt reports swelling, drainage and redness in the left eye since lats night. Pt is using eye drops for pink eye.

## 2022-06-04 NOTE — Discharge Instructions (Addendum)
Stop using the medications that you are prescribed at your last visit.  Please start Augmentin twice daily to cover for bacterial infection (this should also cover for UTI).  Use ofloxacin drop in the left eye 1 time 4 times daily.  Do not touch tip of medication bottle to the eye make sure to wash your hands prior daily medication in order to prevent contamination.  I think it is importantly follow-up with an ophthalmologist.  Please call them to schedule an appointment soon as possible.  Do not use contacts until your symptoms resolve.  You can use lubricating eyedrops for additional symptom relief.  If you have any worsening or changing symptoms including visual disturbance, eye pain, headache, swelling of your eye, pain when you move your eyes, fever you need to be seen immediately.

## 2022-06-04 NOTE — ED Provider Notes (Signed)
[facility]    CSN: [id number] Arrival date & time: 06/04/22  0851      History   Chief Complaint Chief Complaint  Patient presents with   Eye Problem    Entered by patient    HPI Cindy Boyle is a 27 y.o. female.   Patient presents today for reevaluation of of left eye redness and drainage.  She was evaluated by a different urgent care on 05/29/2022 at which point she had similar symptoms.  She was started on tobramycin eyedrops which initially provided some relief of symptoms.  Unfortunately over the past 24 hours she has had a recurrence of redness, drainage, swelling around her eye.  She does wear glasses but does not routinely wear contacts.  She has an ophthalmologist but has not seen them recently.  She denies any visual disturbance, ocular pain, foreign body sensation.  She denies any known injury or exposure to chemicals.  Reports that she initially thought symptoms were related to allergies as she had some itching and irritation in her eyes as well as some nasal congestion.  She has taken Benadryl as well as cetirizine without improvement of symptoms.  Denies any cough, fever, nausea, vomiting, headache, dizziness.  She is currently taking cephalexin as she was also diagnosed with a UTI at her last visit.  She denies additional antibiotics in the past few months.  She has not been using any over-the-counter medications for symptom management.    Past Medical History:  Diagnosis Date   Allergy    Arthritis    Asthma    Heart murmur    Migraines     Patient Active Problem List   Diagnosis Date Noted   Mild concussion 12/23/2016    Past Surgical History:  Procedure Laterality Date   NO PAST SURGERIES      OB History     Gravida  1   Para      Term      Preterm      AB      Living         SAB      IAB      Ectopic      Multiple      Live Births               Home Medications    Prior to Admission medications   Medication Sig  Start Date End Date Taking? Authorizing Provider  amoxicillin-clavulanate (AUGMENTIN) 875-125 MG tablet Take 1 tablet by mouth every 12 (twelve) hours. 06/04/22   Meaghann Choo, Noberto Retort, PA-C  buPROPion HCl (WELLBUTRIN XL PO) Take 150 mg by mouth daily.    [provider]  fexofenadine (ALLEGRA) 60 MG tablet Take 1 tablet (60 mg total) by mouth 2 (two) times daily. 03/22/20   Hall-Potvin, [place], PA-C  fluticasone (FLONASE) 50 MCG/ACT nasal spray Place 1 spray into both nostrils daily. 03/22/20   Hall-Potvin, [place], PA-C  ibuprofen (ADVIL) 600 MG tablet Take 1 tablet (600 mg total) by mouth every 6 (six) hours as needed. 02/12/22   Derwood Kaplan, MD  loratadine (CLARITIN) 10 MG tablet Take 10 mg by mouth daily.    [provider]  metoCLOPramide (REGLAN) 10 MG tablet Take 1 tablet (10 mg total) by mouth every 6 (six) hours as needed (HA). 08/01/21   Donette Larry, CNM  ofloxacin (OCUFLOX) 0.3 % ophthalmic solution Place 1 drop into the left eye 4 (four) times daily. 06/04/22   Jaquarious Grey, Noberto Retort,  PA-C  ondansetron (ZOFRAN-ODT) 4 MG disintegrating tablet 4mg  ODT q4 hours prn nausea/vomit 02/10/22   Deno Etienne, DO  oxyCODONE-acetaminophen (PERCOCET/ROXICET) 5-325 MG tablet Take 1 tablet by mouth every 8 (eight) hours as needed for severe pain. 02/12/22   Varney Biles, MD  Pantoprazole Sodium (PROTONIX PO) Take 40 mg by mouth daily.    [provider]  Prenatal Vit-Fe Fumarate-FA (PRENATAL MULTIVITAMIN) TABS tablet Take 1 tablet by mouth daily at 12 noon.    [provider]    Family History Family History  Problem Relation Age of Onset   Healthy Mother    Healthy Father     Social History Social History   Tobacco Use   Smoking status: Never   Smokeless tobacco: Never  Vaping Use   Vaping Use: Never used  Substance Use Topics   Alcohol use: Not Currently   Drug use: No     Allergies   Banana, Cherry, Corn oil, Daucus carota, Other, Dust mite extract,  Molds & smuts, Tree extract, Acetaminophen, and Latex   Review of Systems Review of Systems  Constitutional:  Positive for activity change. Negative for appetite change, fatigue and fever.  HENT:  Positive for congestion. Negative for sinus pressure, sneezing and sore throat.   Eyes:  Positive for discharge, redness and itching. Negative for photophobia, pain and visual disturbance.  Respiratory:  Negative for cough and shortness of breath.   Cardiovascular:  Negative for chest pain.  Gastrointestinal:  Negative for abdominal pain, diarrhea, nausea and vomiting.  Neurological:  Negative for dizziness, light-headedness and headaches.     Physical Exam Triage Vital Signs ED Triage Vitals  Enc Vitals Group     BP 06/04/22 0927 129/83     Pulse Rate 06/04/22 0927 91     Resp 06/04/22 0927 16     Temp 06/04/22 0927 98.1 F (36.7 C)     Temp Source 06/04/22 0927 Oral     SpO2 06/04/22 0927 96 %     Weight --      Height --      Head Circumference --      Peak Flow --      Pain Score 06/04/22 0933 0     Pain Loc --      Pain Edu? --      Excl. in [place]? --    No data found.  Updated Vital Signs BP 129/83 (BP Location: Left Arm)   Pulse 91   Temp 98.1 F (36.7 C) (Oral)   Resp 16   LMP 06/02/2022 (Exact Date)   SpO2 96%   Breastfeeding No   Visual Acuity Right Eye Distance: 20/30 (With correction) Left Eye Distance: 20/50 (With correction) Bilateral Distance: 20/30 (With correction)  Right Eye Near:   Left Eye Near:    Bilateral Near:     Physical Exam Vitals reviewed.  Constitutional:      General: She is awake. She is not in acute distress.    Appearance: Normal appearance. She is well-developed. She is not ill-appearing.     Comments: Very pleasant female appears stated age in no acute distress sitting comfortably in exam room  HENT:     Head: Normocephalic and atraumatic.     Mouth/Throat:     Pharynx: Uvula midline. No oropharyngeal exudate or posterior  oropharyngeal erythema.  Eyes:     Extraocular Movements: Extraocular movements intact.     Conjunctiva/sclera:     Left eye: Left conjunctiva is injected. No  chemosis.    Pupils: Pupils are equal, round, and reactive to light.     Comments: Left eye: Moderate swelling noted of left upper eyelid.  Normal extraocular movements, however, patient did have some discomfort with looking to the left.  Significant debris noted upper and lower eyelid with purulent drainage in medial canthus.  Cardiovascular:     Rate and Rhythm: Normal rate and regular rhythm.     Heart sounds: Normal heart sounds, S1 normal and S2 normal. No murmur heard. Pulmonary:     Effort: Pulmonary effort is normal.     Breath sounds: Normal breath sounds. No wheezing, rhonchi or rales.     Comments: Clear to auscultation bilaterally Psychiatric:        Behavior: Behavior is cooperative.      UC Treatments / Results  Labs (all labs ordered are listed, but only abnormal results are displayed) Labs Reviewed - No data to display  EKG   Radiology No results found.  Procedures Procedures (including critical care time)  Medications Ordered in UC Medications - No data to display  Initial Impression / Assessment and Plan / UC Course  I have reviewed the triage vital signs and the nursing notes.  Pertinent labs & imaging results that were available during my care of the patient were reviewed by me and considered in my medical decision making (see chart for details).     Patient is well-appearing, afebrile, nontoxic, nontachycardic.  Clinical presentation is consistent with bacterial conjunctivitis.  Will switch from tobramycin to ofloxacin.  Discussed that she should avoid touching tip of medication bottle to the eye and wash hands prior to healing medication in order to prevent contamination.  She can continue the antihistamines in case there is an allergic component.  Given recurrent symptoms recommend close  follow-up with ophthalmology; she was given contact information for local provider with instruction to call to schedule an appointment.  Since she had some pain with extraocular movements as well as swelling will also cover for preseptal cellulitis.  She was transition from cephalexin to Augmentin for better coverage.  She does not use contacts until her symptoms resolved.  We discussed that if her symptoms are worsening or changing she needs to be seen immediately including eye pain, visual disturbance, fever, increasing swelling, nausea, vomiting, weakness.  Strict return precautions given.  Work excuse note provided.  Final Clinical Impressions(s) / UC Diagnoses   Final diagnoses:  Acute bacterial conjunctivitis of left eye  Swelling of left eyelid     Discharge Instructions      Stop using the medications that you are prescribed at your last visit.  Please start Augmentin twice daily to cover for bacterial infection (this should also cover for UTI).  Use ofloxacin drop in the left eye 1 time 4 times daily.  Do not touch tip of medication bottle to the eye make sure to wash your hands prior daily medication in order to prevent contamination.  I think it is importantly follow-up with an ophthalmologist.  Please call them to schedule an appointment soon as possible.  Do not use contacts until your symptoms resolve.  You can use lubricating eyedrops for additional symptom relief.  If you have any worsening or changing symptoms including visual disturbance, eye pain, headache, swelling of your eye, pain when you move your eyes, fever you need to be seen immediately.     ED Prescriptions     Medication Sig Dispense Auth. Provider   amoxicillin-clavulanate (AUGMENTIN) 875-125  MG tablet Take 1 tablet by mouth every 12 (twelve) hours. 14 tablet Ainhoa Rallo K, PA-C   ofloxacin (OCUFLOX) 0.3 % ophthalmic solution Place 1 drop into the left eye 4 (four) times daily. 5 mL Timarie Labell K, PA-C       PDMP not reviewed this encounter.   Jeani Hawking, PA-C 06/04/22 1017

## 2022-06-16 DIAGNOSIS — H1089 Other conjunctivitis: Secondary | ICD-10-CM | POA: Diagnosis not present

## 2022-10-02 DIAGNOSIS — Z1231 Encounter for screening mammogram for malignant neoplasm of breast: Secondary | ICD-10-CM

## 2022-10-24 DIAGNOSIS — N926 Irregular menstruation, unspecified: Secondary | ICD-10-CM | POA: Diagnosis not present

## 2022-11-26 DIAGNOSIS — Z3490 Encounter for supervision of normal pregnancy, unspecified, unspecified trimester: Secondary | ICD-10-CM | POA: Diagnosis not present

## 2023-01-05 DIAGNOSIS — N8312 Corpus luteum cyst of left ovary: Secondary | ICD-10-CM | POA: Diagnosis not present

## 2023-01-05 DIAGNOSIS — O3680X0 Pregnancy with inconclusive fetal viability, not applicable or unspecified: Secondary | ICD-10-CM | POA: Diagnosis not present

## 2023-01-05 DIAGNOSIS — O3481 Maternal care for other abnormalities of pelvic organs, first trimester: Secondary | ICD-10-CM | POA: Diagnosis not present

## 2023-01-05 DIAGNOSIS — O09291 Supervision of pregnancy with other poor reproductive or obstetric history, first trimester: Secondary | ICD-10-CM | POA: Diagnosis not present

## 2023-01-22 DIAGNOSIS — Z3481 Encounter for supervision of other normal pregnancy, first trimester: Secondary | ICD-10-CM | POA: Diagnosis not present

## 2023-01-22 DIAGNOSIS — Z1379 Encounter for other screening for genetic and chromosomal anomalies: Secondary | ICD-10-CM | POA: Diagnosis not present

## 2023-01-25 ENCOUNTER — Other Ambulatory Visit: Payer: Self-pay

## 2023-01-25 ENCOUNTER — Emergency Department (HOSPITAL_BASED_OUTPATIENT_CLINIC_OR_DEPARTMENT_OTHER)
Admission: EM | Admit: 2023-01-25 | Discharge: 2023-01-25 | Disposition: A | Discharge status: Home / Self Care | Payer: BC Managed Care – PPO | Attending: Emergency Medicine | Admitting: Emergency Medicine

## 2023-01-25 ENCOUNTER — Encounter (HOSPITAL_BASED_OUTPATIENT_CLINIC_OR_DEPARTMENT_OTHER): Payer: Self-pay

## 2023-01-25 DIAGNOSIS — O219 Vomiting of pregnancy, unspecified: Secondary | ICD-10-CM | POA: Diagnosis not present

## 2023-01-25 DIAGNOSIS — R112 Nausea with vomiting, unspecified: Secondary | ICD-10-CM | POA: Insufficient documentation

## 2023-01-25 DIAGNOSIS — R000 Tachycardia, unspecified: Secondary | ICD-10-CM | POA: Insufficient documentation

## 2023-01-25 DIAGNOSIS — Z91040 Latex allergy status: Secondary | ICD-10-CM | POA: Diagnosis not present

## 2023-01-25 DIAGNOSIS — R197 Diarrhea, unspecified: Secondary | ICD-10-CM | POA: Diagnosis not present

## 2023-01-25 DIAGNOSIS — Z3A13 13 weeks gestation of pregnancy: Secondary | ICD-10-CM | POA: Diagnosis not present

## 2023-01-25 LAB — CBC WITH DIFFERENTIAL/PLATELET
Abs Immature Granulocytes: 0.03 10*3/uL (ref 0.00–0.07)
Basophils Absolute: 0 10*3/uL (ref 0.0–0.1)
Basophils Relative: 0 %
Eosinophils Absolute: 0 10*3/uL (ref 0.0–0.5)
Eosinophils Relative: 0 %
HCT: 38.5 % (ref 36.0–46.0)
Hemoglobin: 13.2 g/dL (ref 12.0–15.0)
Immature Granulocytes: 0 %
Lymphocytes Relative: 4 %
Lymphs Abs: 0.4 10*3/uL — ABNORMAL LOW (ref 0.7–4.0)
MCH: 29.5 pg (ref 26.0–34.0)
MCHC: 34.3 g/dL (ref 30.0–36.0)
MCV: 86.1 fL (ref 80.0–100.0)
Monocytes Absolute: 0.4 10*3/uL (ref 0.1–1.0)
Monocytes Relative: 4 %
Neutro Abs: 8.7 10*3/uL — ABNORMAL HIGH (ref 1.7–7.7)
Neutrophils Relative %: 92 %
Platelets: 187 10*3/uL (ref 150–400)
RBC: 4.47 MIL/uL (ref 3.87–5.11)
RDW: 13.6 % (ref 11.5–15.5)
WBC: 9.6 10*3/uL (ref 4.0–10.5)
nRBC: 0 % (ref 0.0–0.2)

## 2023-01-25 LAB — COMPREHENSIVE METABOLIC PANEL
ALT: 15 U/L (ref 0–44)
AST: 15 U/L (ref 15–41)
Albumin: 4.3 g/dL (ref 3.5–5.0)
Alkaline Phosphatase: 50 U/L (ref 38–126)
Anion gap: 11 (ref 5–15)
BUN: 9 mg/dL (ref 6–20)
CO2: 21 mmol/L — ABNORMAL LOW (ref 22–32)
Calcium: 9.3 mg/dL (ref 8.9–10.3)
Chloride: 103 mmol/L (ref 98–111)
Creatinine, Ser: 0.67 mg/dL (ref 0.44–1.00)
GFR, Estimated: >60 mL/min (ref >60)
Glucose, Bld: 102 mg/dL — ABNORMAL HIGH (ref 70–99)
Potassium: 3.4 mmol/L — ABNORMAL LOW (ref 3.5–5.1)
Sodium: 135 mmol/L (ref 135–145)
Total Bilirubin: 0.5 mg/dL (ref 0.3–1.2)
Total Protein: 7.6 g/dL (ref 6.5–8.1)

## 2023-01-25 LAB — LIPASE, BLOOD: Lipase: 10 U/L — ABNORMAL LOW (ref 11–51)

## 2023-01-25 MED ORDER — LACTATED RINGERS IV BOLUS
1000.0000 mL | Freq: Once | INTRAVENOUS | Status: AC
  Administered 2023-01-25: 1000 mL via INTRAVENOUS

## 2023-01-25 MED ORDER — DIPHENHYDRAMINE HCL 50 MG/ML IJ SOLN
25.0000 mg | Freq: Once | INTRAMUSCULAR | Status: AC
  Administered 2023-01-25: 25 mg via INTRAVENOUS
  Filled 2023-01-25: qty 1

## 2023-01-25 MED ORDER — METOCLOPRAMIDE HCL 10 MG PO TABS: 10.0000 mg | ORAL_TABLET | Freq: Four times a day (QID) | ORAL | 0 refills | Status: AC

## 2023-01-25 MED ORDER — DOXYLAMINE-PYRIDOXINE 10-10 MG PO TBEC: 1.0000 | DELAYED_RELEASE_TABLET | Freq: Every day | ORAL | 0 refills | Status: AC

## 2023-01-25 MED ORDER — METOCLOPRAMIDE HCL 5 MG/ML IJ SOLN
10.0000 mg | Freq: Once | INTRAMUSCULAR | Status: AC
  Administered 2023-01-25: 10 mg via INTRAVENOUS
  Filled 2023-01-25: qty 2

## 2023-01-25 NOTE — ED Notes (Signed)
Pt resting. Husband at bedside. He will notify the pt that we need a urine sample once she is awake.

## 2023-01-25 NOTE — ED Provider Notes (Signed)
EMERGENCY DEPARTMENT AT [facility] Provider Note   CSN: [id number] Arrival date & time: 01/25/23  2016     History  Chief Complaint  Patient presents with   Nausea    Cindy Boyle is a 27 y.o. female.  27 yo  F with a chief complaints of nausea vomiting and diarrhea.  The patient's son had a similar illness and she thinks that maybe she caught it from him.  She tried some imodium earlier today without improvement.  Unable to tolerate anything by mouth.         Home Medications Prior to Admission medications   Medication Sig Start Date End Date Taking? Authorizing Provider  Doxylamine-Pyridoxine 10-10 MG TBEC Take 1 tablet by mouth at bedtime. If you have ongoing nausea you can take a second pill in the morning 01/25/23  Yes Melene Plan, DO  metoCLOPramide (REGLAN) 10 MG tablet Take 1 tablet (10 mg total) by mouth every 6 (six) hours. 01/25/23  Yes Melene Plan, DO  amoxicillin-clavulanate (AUGMENTIN) 875-125 MG tablet Take 1 tablet by mouth every 12 (twelve) hours. 06/04/22   Raspet, Noberto Retort, PA-C  buPROPion HCl (WELLBUTRIN XL PO) Take 150 mg by mouth daily.    [provider]  fexofenadine (ALLEGRA) 60 MG tablet Take 1 tablet (60 mg total) by mouth 2 (two) times daily. 03/22/20   Hall-Potvin, [place], PA-C  fluticasone (FLONASE) 50 MCG/ACT nasal spray Place 1 spray into both nostrils daily. 03/22/20   Hall-Potvin, [place], PA-C  ibuprofen (ADVIL) 600 MG tablet Take 1 tablet (600 mg total) by mouth every 6 (six) hours as needed. 02/12/22   Derwood Kaplan, MD  loratadine (CLARITIN) 10 MG tablet Take 10 mg by mouth daily.    [provider]  ofloxacin (OCUFLOX) 0.3 % ophthalmic solution Place 1 drop into the left eye 4 (four) times daily. 06/04/22   Raspet, Noberto Retort, PA-C  ondansetron (ZOFRAN-ODT) 4 MG disintegrating tablet 4mg  ODT q4 hours prn nausea/vomit 02/10/22   Melene Plan, DO  oxyCODONE-acetaminophen (PERCOCET/ROXICET) 5-325 MG tablet Take 1 tablet  by mouth every 8 (eight) hours as needed for severe pain. 02/12/22   Derwood Kaplan, MD  Pantoprazole Sodium (PROTONIX PO) Take 40 mg by mouth daily.    [provider]  Prenatal Vit-Fe Fumarate-FA (PRENATAL MULTIVITAMIN) TABS tablet Take 1 tablet by mouth daily at 12 noon.    [provider]      Allergies    Banana, Cherry, Corn oil, Daucus carota, Other, Dust mite extract, Molds & smuts, Tree extract, Acetaminophen, and Latex    Review of Systems   Review of Systems  Physical Exam Updated Vital Signs BP 110/67   Pulse 95   Temp 98.1 F (36.7 C) (Oral)   Resp 18   Wt 106.6 kg   LMP 06/02/2022 (Exact Date)   SpO2 100%   BMI 31.00 kg/m  Physical Exam Vitals and nursing note reviewed.  Constitutional:      General: She is not in acute distress.    Appearance: She is well-developed. She is not diaphoretic.  HENT:     Head: Normocephalic and atraumatic.  Eyes:     Pupils: Pupils are equal, round, and reactive to light.  Cardiovascular:     Rate and Rhythm: Regular rhythm. Tachycardia present.     Heart sounds: No murmur heard.    No friction rub. No gallop.  Pulmonary:     Effort: Pulmonary effort is normal.     Breath sounds:  No wheezing or rales.  Abdominal:     General: There is no distension.     Palpations: Abdomen is soft.     Tenderness: There is no abdominal tenderness.  Musculoskeletal:        General: No tenderness.     Cervical back: Normal range of motion and neck supple.  Skin:    General: Skin is warm and dry.  Neurological:     Mental Status: She is alert and oriented to person, place, and time.  Psychiatric:        Behavior: Behavior normal.     ED Results / Procedures / Treatments   Labs (all labs ordered are listed, but only abnormal results are displayed) Labs Reviewed  CBC WITH DIFFERENTIAL/PLATELET - Abnormal; Notable for the following components:      Result Value   Neutro Abs 8.7 (*)    Lymphs Abs 0.4 (*)    All  other components within normal limits  COMPREHENSIVE METABOLIC PANEL - Abnormal; Notable for the following components:   Potassium 3.4 (*)    CO2 21 (*)    Glucose, Bld 102 (*)    All other components within normal limits  LIPASE, BLOOD - Abnormal; Notable for the following components:   Lipase 10 (*)    All other components within normal limits  URINALYSIS, ROUTINE W REFLEX MICROSCOPIC    EKG None  Radiology No results found.  Procedures Procedures    Medications Ordered in ED Medications  lactated ringers bolus 1,000 mL (1,000 mLs Intravenous New Bag/Given 01/25/23 2046)  metoCLOPramide (REGLAN) injection 10 mg (10 mg Intravenous Given 01/25/23 2046)  diphenhydrAMINE (BENADRYL) injection 25 mg (25 mg Intravenous Given 01/25/23 2046)    ED Course/ Medical Decision Making/ A&P                                 Medical Decision Making Amount and/or Complexity of Data Reviewed Labs: ordered.  Risk Prescription drug management.   27 yo F gravida 2 para 1 who is 12 weeks and 4 days gestation by a 9-week ultrasound demonstrating an IUP comes in with a chief complaints of intractable nausea and vomiting as well as diarrhea.  Son has a similar illness at home.  Most likely viral in etiology.  Patient significantly tachycardic on arrival with heart rates into the 140s.  Will give a bolus of IV fluids antiemetics reassess.  Patient's blood work is resulted without significant electrolyte abnormality, no anemia.  LFTs and lipase are unremarkable.  The patient is feeling quite a bit better.  She has not provided a urine sample but would like to go home at this time and will follow-up with her OB/GYN in the office.  9:53 PM:  I have discussed the diagnosis/risks/treatment options with the patient.  Evaluation and diagnostic testing in the emergency department does not suggest an emergent condition requiring admission or immediate intervention beyond what has been performed at this time.   They will follow up with PCP. We also discussed returning to the ED immediately if new or worsening sx occur. We discussed the sx which are most concerning (e.g., sudden worsening pain, fever, inability to tolerate by mouth) that necessitate immediate return. Medications administered to the patient during their visit and any new prescriptions provided to the patient are listed below.  Medications given during this visit Medications  lactated ringers bolus 1,000 mL (1,000 mLs Intravenous New Bag/Given 01/25/23  2046)  metoCLOPramide (REGLAN) injection 10 mg (10 mg Intravenous Given 01/25/23 2046)  diphenhydrAMINE (BENADRYL) injection 25 mg (25 mg Intravenous Given 01/25/23 2046)     The patient appears reasonably screen and/or stabilized for discharge and I doubt any other medical condition or other [facility] requiring further screening, evaluation, or treatment in the ED at this time prior to discharge.          Final Clinical Impression(s) / ED Diagnoses Final diagnoses:  Nausea vomiting and diarrhea    Rx / DC Orders ED Discharge Orders          Ordered    Doxylamine-Pyridoxine 10-10 MG TBEC  Daily at bedtime        01/25/23 2152    metoCLOPramide (REGLAN) 10 MG tablet  Every 6 hours        01/25/23 2152              Melene Plan, DO 01/25/23 2153

## 2023-01-25 NOTE — Discharge Instructions (Signed)
Please call your OB/GYN in the morning and let them know how you are doing.  Please return for worsening symptoms.

## 2023-01-25 NOTE — ED Triage Notes (Signed)
Pt to triage c/o NVD x 24 hours with 12 plus episodes of emesis. Pt denies pain fever or chills.   Pt states she is [redacted] weeks pregnant G- 2 P -1 A -0. IV established Labs drawn

## 2023-01-26 DIAGNOSIS — Z3A09 9 weeks gestation of pregnancy: Secondary | ICD-10-CM | POA: Diagnosis not present

## 2023-01-26 DIAGNOSIS — Z3A12 12 weeks gestation of pregnancy: Secondary | ICD-10-CM | POA: Diagnosis not present

## 2023-01-26 DIAGNOSIS — O219 Vomiting of pregnancy, unspecified: Secondary | ICD-10-CM | POA: Diagnosis not present

## 2023-01-26 DIAGNOSIS — O99211 Obesity complicating pregnancy, first trimester: Secondary | ICD-10-CM | POA: Diagnosis not present

## 2023-01-27 ENCOUNTER — Inpatient Hospital Stay (HOSPITAL_COMMUNITY)
Admission: AD | Admit: 2023-01-27 | Discharge: 2023-01-27 | Disposition: A | Discharge status: Home / Self Care | Payer: BC Managed Care – PPO | Attending: Obstetrics & Gynecology | Admitting: Obstetrics & Gynecology

## 2023-01-27 DIAGNOSIS — O2341 Unspecified infection of urinary tract in pregnancy, first trimester: Secondary | ICD-10-CM

## 2023-01-27 DIAGNOSIS — R109 Unspecified abdominal pain: Secondary | ICD-10-CM | POA: Diagnosis not present

## 2023-01-27 DIAGNOSIS — O26891 Other specified pregnancy related conditions, first trimester: Secondary | ICD-10-CM | POA: Diagnosis not present

## 2023-01-27 DIAGNOSIS — Z3A12 12 weeks gestation of pregnancy: Secondary | ICD-10-CM | POA: Diagnosis not present

## 2023-01-27 DIAGNOSIS — O2340 Unspecified infection of urinary tract in pregnancy, unspecified trimester: Secondary | ICD-10-CM

## 2023-01-27 LAB — URINALYSIS, MICROSCOPIC (REFLEX): WBC, UA: >50 WBC/hpf (ref 0–5)

## 2023-01-27 LAB — URINALYSIS, ROUTINE W REFLEX MICROSCOPIC
Bilirubin Urine: NEGATIVE
Glucose, UA: NEGATIVE mg/dL
Ketones, ur: NEGATIVE mg/dL
Nitrite: POSITIVE — AB
Protein, ur: NEGATIVE mg/dL
Specific Gravity, Urine: 1.015 (ref 1.005–1.030)
pH: 6 (ref 5.0–8.0)

## 2023-01-27 LAB — WET PREP, GENITAL
Clue Cells Wet Prep HPF POC: NONE SEEN
Sperm: NONE SEEN
Trich, Wet Prep: NONE SEEN
WBC, Wet Prep HPF POC: >=10 — AB (ref <10)
Yeast Wet Prep HPF POC: NONE SEEN

## 2023-01-27 MED ORDER — CEFADROXIL 500 MG PO CAPS
500.0000 mg | ORAL_CAPSULE | Freq: Two times a day (BID) | ORAL | 0 refills | Status: AC
Start: 2023-01-27 — End: ?

## 2023-01-27 MED ORDER — ACETAMINOPHEN 325 MG PO TABS
650.0000 mg | ORAL_TABLET | Freq: Once | ORAL | Status: AC
  Administered 2023-01-27: 650 mg via ORAL
  Filled 2023-01-27: qty 2

## 2023-01-27 MED ORDER — CYCLOBENZAPRINE HCL 5 MG PO TABS
10.0000 mg | ORAL_TABLET | Freq: Once | ORAL | Status: DC
  Filled 2023-01-27: qty 2

## 2023-01-27 MED ORDER — ONDANSETRON 4 MG PO TBDP
8.0000 mg | ORAL_TABLET | Freq: Once | ORAL | Status: AC
  Administered 2023-01-27: 8 mg via ORAL
  Filled 2023-01-27: qty 2

## 2023-01-27 NOTE — MAU Provider Note (Signed)
History     CSN: [id number]  Arrival date and time: 01/27/23 5284   Event Date/Time   First Provider Initiated Contact with Patient 01/27/23 1125      Chief Complaint  Patient presents with   Back Pain    Cindy Boyle is a 27 y.o. G2P1001 at [redacted]w[redacted]d who receives care at [facility]; [place].  She presents today for abdominal and back pain.  Patient states she has been having constant "back spasm and stabbing" type pain since last night.  She reports it "lessens into a dull pain at times."  She denies any interventions that causes this to occur.  She states the pain is worse with "lifting my son."  She rates the pain a 6.5/10, but states it was a 7.5/10 upon arrival and 10/10 last night.  She reports having "several kidney infections" and states this pain is similar.  However, she states she has not been urinating enough to identity any pain or discomfort with urination.   OB History     Gravida  2   Para      Term      Preterm      AB      Living         SAB      IAB      Ectopic      Multiple      Live Births              Past Medical History:  Diagnosis Date   Allergy    Arthritis    Asthma    Heart murmur    Migraines     Past Surgical History:  Procedure Laterality Date   NO PAST SURGERIES      Family History  Problem Relation Age of Onset   Healthy Mother    Healthy Father     Social History   Tobacco Use   Smoking status: Never   Smokeless tobacco: Never  Vaping Use   Vaping status: Never Used  Substance Use Topics   Alcohol use: Not Currently   Drug use: No    Allergies:  Allergies  Allergen Reactions   Banana Hives and Itching   Cherry Anaphylaxis, Swelling and Itching   Corn Oil Anaphylaxis   Daucus Carota Hives and Itching   Other Anaphylaxis and Itching    Nuts  Soy  Apples (whole apples with skin)  Soy Beans (only the whole bean)  Corn (whole kernel, oil or corn based products ok)   Dust Mite Extract  Hives   Molds & Smuts Hives   Tree Extract Hives   Acetaminophen Nausea And Vomiting and Nausea Only    patient does not wish to receive   Latex Hives    Medications Prior to Admission  Medication Sig Dispense Refill Last Dose   amoxicillin-clavulanate (AUGMENTIN) 875-125 MG tablet Take 1 tablet by mouth every 12 (twelve) hours. 14 tablet 0    buPROPion HCl (WELLBUTRIN XL PO) Take 150 mg by mouth daily.      Doxylamine-Pyridoxine 10-10 MG TBEC Take 1 tablet by mouth at bedtime. If you have ongoing nausea you can take a second pill in the morning 60 tablet 0    fexofenadine (ALLEGRA) 60 MG tablet Take 1 tablet (60 mg total) by mouth 2 (two) times daily. 60 tablet 0    fluticasone (FLONASE) 50 MCG/ACT nasal spray Place 1 spray into both nostrils daily. 16 g 0    ibuprofen (ADVIL)  600 MG tablet Take 1 tablet (600 mg total) by mouth every 6 (six) hours as needed. 20 tablet 0    loratadine (CLARITIN) 10 MG tablet Take 10 mg by mouth daily.      metoCLOPramide (REGLAN) 10 MG tablet Take 1 tablet (10 mg total) by mouth every 6 (six) hours. 20 tablet 0    ofloxacin (OCUFLOX) 0.3 % ophthalmic solution Place 1 drop into the left eye 4 (four) times daily. 5 mL 0    ondansetron (ZOFRAN-ODT) 4 MG disintegrating tablet 4mg  ODT q4 hours prn nausea/vomit 20 tablet 0    oxyCODONE-acetaminophen (PERCOCET/ROXICET) 5-325 MG tablet Take 1 tablet by mouth every 8 (eight) hours as needed for severe pain. 9 tablet 0    Pantoprazole Sodium (PROTONIX PO) Take 40 mg by mouth daily.      Prenatal Vit-Fe Fumarate-FA (PRENATAL MULTIVITAMIN) TABS tablet Take 1 tablet by mouth daily at 12 noon.       Review of Systems  Gastrointestinal:  Positive for abdominal pain. Negative for constipation, diarrhea, nausea and vomiting.  Genitourinary:  Positive for vaginal discharge (No itching, irritation, or odor-States SO reported it "smelled off."). Negative for difficulty urinating, dysuria and vaginal bleeding.   Musculoskeletal:  Positive for back pain.  Neurological:  Negative for dizziness, light-headedness and headaches.   Physical Exam   Blood pressure 123/70, pulse 98, temperature 98.4 F (36.9 C), resp. rate 18, height 6' 1.5" (1.867 m), weight 106.1 kg, last menstrual period 06/02/2022.  Physical Exam Vitals reviewed.  Constitutional:      Appearance: Normal appearance.  HENT:     Head: Normocephalic and atraumatic.  Eyes:     Conjunctiva/sclera: Conjunctivae normal.  Cardiovascular:     Rate and Rhythm: Normal rate.  Pulmonary:     Effort: Pulmonary effort is normal.  Musculoskeletal:     Cervical back: Normal range of motion.  Skin:    General: Skin is warm and dry.  Neurological:     Mental Status: She is alert and oriented to person, place, and time.  Psychiatric:        Mood and Affect: Mood normal.        Behavior: Behavior normal.     MAU Course  Procedures Results for orders placed or performed during the hospital encounter of 01/27/23 (from the past 24 hour(s))  Urinalysis, Routine w reflex microscopic -Urine, Clean Catch     Status: Abnormal   Collection Time: 01/27/23  9:51 AM  Result Value Ref Range   Color, Urine YELLOW YELLOW   APPearance CLOUDY (A) CLEAR   Specific Gravity, Urine 1.015 1.005 - 1.030   pH 6.0 5.0 - 8.0   Glucose, UA NEGATIVE NEGATIVE mg/dL   Hgb urine dipstick LARGE (A) NEGATIVE   Bilirubin Urine NEGATIVE NEGATIVE   Ketones, ur NEGATIVE NEGATIVE mg/dL   Protein, ur NEGATIVE NEGATIVE mg/dL   Nitrite POSITIVE (A) NEGATIVE   Leukocytes,Ua LARGE (A) NEGATIVE  Urinalysis, Microscopic (reflex)     Status: Abnormal   Collection Time: 01/27/23  9:51 AM  Result Value Ref Range   RBC / HPF 21-50 0 - 5 RBC/hpf   WBC, UA >50 0 - 5 WBC/hpf   Bacteria, UA MANY (A) NONE SEEN   Squamous Epithelial / HPF 6-10 0 - 5 /HPF   Non Squamous Epithelial PRESENT (A) NONE SEEN   WBC Clumps PRESENT   Wet prep, genital     Status: Abnormal   Collection  Time: 01/27/23 10:22 AM  Specimen: PATH Cytology Cervicovaginal Ancillary Only  Result Value Ref Range   Yeast Wet Prep HPF POC NONE SEEN NONE SEEN   Trich, Wet Prep NONE SEEN NONE SEEN   Clue Cells Wet Prep HPF POC NONE SEEN NONE SEEN   WBC, Wet Prep HPF POC >=10 (A) <10   Sperm NONE SEEN     MDM Labs: UA, UC Cultures Pain medication Prescription Assessment and Plan  27 year old G2P1001 at 12.6 weeks Flank Pain  -Reviewed POC with patient. -Exam performed and findings discussed.  -Patient offered and accepts pain medication. -Discussed usage of flexeril and patient reports taking in past despite corn oil allergy. -Patient states she also can take tylenol, but it causes stomach upset. -Informed will start with just flexeril and reassess. -Awaiting UA.    Cherre Robins 01/27/2023, 11:25 AM   Reassessment (12:02 PM) -Patient refusing flexeril stating she drove self and her SO unable to drive her home as they drove separate. -Patient requesting tylenol and medication for nausea to help offset tylenol. -Will give zofran. -UA pending. -Monitor and reassess.   Reassessment (12:43 PM) -Patient pain now 5/10 -UA as above. -Discussed sending prescription for treatment. Rx for Cefadroxil sent to pharmacy on file.  -Encouraged to talk to primary ob regarding suppressive therapy as patient reports 5 UTIs in past 2 years.  -Patient verbalizes understanding. -Return precautions given. -Encouraged to call primary office or return to MAU if symptoms worsen or with the onset of new symptoms. -Discharged to home in stable condition.  Cherre Robins MSN, CNM Advanced Practice Provider, [facility]

## 2023-01-27 NOTE — MAU Note (Signed)
.  Cindy Boyle is a 27 y.o. at [redacted]w[redacted]d here in MAU reporting: c/o sharp pain in her right flank back pain. Stabbing pain and maybe a spasm. Pt stated she has had kidney infections before pain is similar but not exactly like that. No pain or burning with urination LMP:  Onset of complaint: last night Pain score: 7-8 Vitals:   01/27/23 0943  BP: 123/70  Pulse: 98  Resp: 18  Temp: 98.4 F (36.9 C)     FHT:153 Lab orders placed from triage:  u/a, wet prep, gc

## 2023-01-28 LAB — GC/CHLAMYDIA PROBE AMP (~~LOC~~) NOT AT ARMC
Chlamydia: NEGATIVE
Comment: NEGATIVE
Comment: NORMAL
Neisseria Gonorrhea: NEGATIVE

## 2023-01-29 LAB — CULTURE, OB URINE: Culture: >=100000 — AB

## 2023-02-26 DIAGNOSIS — Z3A17 17 weeks gestation of pregnancy: Secondary | ICD-10-CM | POA: Diagnosis not present

## 2023-02-26 DIAGNOSIS — Z3482 Encounter for supervision of other normal pregnancy, second trimester: Secondary | ICD-10-CM | POA: Diagnosis not present

## 2023-03-02 DIAGNOSIS — Z3492 Encounter for supervision of normal pregnancy, unspecified, second trimester: Secondary | ICD-10-CM | POA: Diagnosis not present

## 2023-03-02 DIAGNOSIS — H1013 Acute atopic conjunctivitis, bilateral: Secondary | ICD-10-CM | POA: Diagnosis not present

## 2023-03-16 DIAGNOSIS — H1033 Unspecified acute conjunctivitis, bilateral: Secondary | ICD-10-CM | POA: Diagnosis not present

## 2023-03-16 DIAGNOSIS — Z3A19 19 weeks gestation of pregnancy: Secondary | ICD-10-CM | POA: Diagnosis not present

## 2023-03-16 DIAGNOSIS — Z3689 Encounter for other specified antenatal screening: Secondary | ICD-10-CM | POA: Diagnosis not present

## 2023-03-16 DIAGNOSIS — O99210 Obesity complicating pregnancy, unspecified trimester: Secondary | ICD-10-CM | POA: Diagnosis not present

## 2023-04-13 DIAGNOSIS — Z3689 Encounter for other specified antenatal screening: Secondary | ICD-10-CM | POA: Diagnosis not present

## 2023-04-13 DIAGNOSIS — Z3A23 23 weeks gestation of pregnancy: Secondary | ICD-10-CM | POA: Diagnosis not present

## 2023-04-13 DIAGNOSIS — Z362 Encounter for other antenatal screening follow-up: Secondary | ICD-10-CM | POA: Diagnosis not present

## 2023-05-06 DIAGNOSIS — K219 Gastro-esophageal reflux disease without esophagitis: Secondary | ICD-10-CM | POA: Diagnosis not present

## 2023-05-06 DIAGNOSIS — F418 Other specified anxiety disorders: Secondary | ICD-10-CM | POA: Diagnosis not present

## 2023-05-06 DIAGNOSIS — O99342 Other mental disorders complicating pregnancy, second trimester: Secondary | ICD-10-CM | POA: Diagnosis not present

## 2023-05-06 DIAGNOSIS — Z6830 Body mass index (BMI) 30.0-30.9, adult: Secondary | ICD-10-CM | POA: Diagnosis not present

## 2023-05-06 DIAGNOSIS — Z3A09 9 weeks gestation of pregnancy: Secondary | ICD-10-CM | POA: Diagnosis not present

## 2023-05-06 DIAGNOSIS — Z8759 Personal history of other complications of pregnancy, childbirth and the puerperium: Secondary | ICD-10-CM | POA: Diagnosis not present

## 2023-05-06 DIAGNOSIS — Z3A27 27 weeks gestation of pregnancy: Secondary | ICD-10-CM | POA: Diagnosis not present

## 2023-05-11 DIAGNOSIS — Z3689 Encounter for other specified antenatal screening: Secondary | ICD-10-CM | POA: Diagnosis not present

## 2023-05-11 DIAGNOSIS — Z3A28 28 weeks gestation of pregnancy: Secondary | ICD-10-CM | POA: Diagnosis not present

## 2023-05-18 DIAGNOSIS — N898 Other specified noninflammatory disorders of vagina: Secondary | ICD-10-CM | POA: Diagnosis not present

## 2023-05-18 DIAGNOSIS — O99210 Obesity complicating pregnancy, unspecified trimester: Secondary | ICD-10-CM | POA: Diagnosis not present

## 2023-05-18 DIAGNOSIS — Z8759 Personal history of other complications of pregnancy, childbirth and the puerperium: Secondary | ICD-10-CM | POA: Diagnosis not present

## 2023-05-18 DIAGNOSIS — F418 Other specified anxiety disorders: Secondary | ICD-10-CM | POA: Diagnosis not present

## 2023-05-18 DIAGNOSIS — F909 Attention-deficit hyperactivity disorder, unspecified type: Secondary | ICD-10-CM | POA: Diagnosis not present

## 2023-05-18 DIAGNOSIS — Z3A28 28 weeks gestation of pregnancy: Secondary | ICD-10-CM | POA: Diagnosis not present

## 2023-05-18 DIAGNOSIS — R109 Unspecified abdominal pain: Secondary | ICD-10-CM | POA: Diagnosis not present

## 2023-06-10 DIAGNOSIS — O99213 Obesity complicating pregnancy, third trimester: Secondary | ICD-10-CM | POA: Diagnosis not present

## 2023-06-10 DIAGNOSIS — Z3689 Encounter for other specified antenatal screening: Secondary | ICD-10-CM | POA: Diagnosis not present

## 2023-06-10 DIAGNOSIS — Z3A32 32 weeks gestation of pregnancy: Secondary | ICD-10-CM | POA: Diagnosis not present

## 2023-07-08 DIAGNOSIS — O99210 Obesity complicating pregnancy, unspecified trimester: Secondary | ICD-10-CM | POA: Diagnosis not present

## 2023-07-08 DIAGNOSIS — Z3A36 36 weeks gestation of pregnancy: Secondary | ICD-10-CM | POA: Diagnosis not present

## 2023-07-08 DIAGNOSIS — Z8759 Personal history of other complications of pregnancy, childbirth and the puerperium: Secondary | ICD-10-CM | POA: Diagnosis not present

## 2023-07-08 DIAGNOSIS — Z3483 Encounter for supervision of other normal pregnancy, third trimester: Secondary | ICD-10-CM | POA: Diagnosis not present

## 2023-07-10 DIAGNOSIS — Z3A36 36 weeks gestation of pregnancy: Secondary | ICD-10-CM | POA: Diagnosis not present

## 2023-07-10 DIAGNOSIS — Z3689 Encounter for other specified antenatal screening: Secondary | ICD-10-CM | POA: Diagnosis not present

## 2023-07-10 DIAGNOSIS — O99213 Obesity complicating pregnancy, third trimester: Secondary | ICD-10-CM | POA: Diagnosis not present

## 2023-07-26 DIAGNOSIS — F419 Anxiety disorder, unspecified: Secondary | ICD-10-CM | POA: Diagnosis not present

## 2023-07-26 DIAGNOSIS — O4292 Full-term premature rupture of membranes, unspecified as to length of time between rupture and onset of labor: Secondary | ICD-10-CM | POA: Diagnosis not present

## 2023-07-26 DIAGNOSIS — O99344 Other mental disorders complicating childbirth: Secondary | ICD-10-CM | POA: Diagnosis not present

## 2023-07-26 DIAGNOSIS — O4202 Full-term premature rupture of membranes, onset of labor within 24 hours of rupture: Secondary | ICD-10-CM | POA: Diagnosis not present

## 2023-07-26 DIAGNOSIS — O99214 Obesity complicating childbirth: Secondary | ICD-10-CM | POA: Diagnosis not present

## 2023-07-26 DIAGNOSIS — D500 Iron deficiency anemia secondary to blood loss (chronic): Secondary | ICD-10-CM | POA: Diagnosis not present

## 2023-07-26 DIAGNOSIS — F909 Attention-deficit hyperactivity disorder, unspecified type: Secondary | ICD-10-CM | POA: Diagnosis not present

## 2023-07-26 DIAGNOSIS — O9081 Anemia of the puerperium: Secondary | ICD-10-CM | POA: Diagnosis not present

## 2023-07-26 DIAGNOSIS — Z3A38 38 weeks gestation of pregnancy: Secondary | ICD-10-CM | POA: Diagnosis not present

## 2023-09-02 DIAGNOSIS — R000 Tachycardia, unspecified: Secondary | ICD-10-CM | POA: Diagnosis not present

## 2023-09-02 DIAGNOSIS — R197 Diarrhea, unspecified: Secondary | ICD-10-CM | POA: Diagnosis not present

## 2023-09-02 DIAGNOSIS — E860 Dehydration: Secondary | ICD-10-CM | POA: Diagnosis not present

## 2023-09-16 DIAGNOSIS — Z803 Family history of malignant neoplasm of breast: Secondary | ICD-10-CM | POA: Diagnosis not present

## 2023-11-17 DIAGNOSIS — J039 Acute tonsillitis, unspecified: Secondary | ICD-10-CM | POA: Diagnosis not present

## 2023-12-23 DIAGNOSIS — R5383 Other fatigue: Secondary | ICD-10-CM | POA: Diagnosis not present

## 2023-12-23 DIAGNOSIS — Z8639 Personal history of other endocrine, nutritional and metabolic disease: Secondary | ICD-10-CM | POA: Diagnosis not present

## 2023-12-23 DIAGNOSIS — R112 Nausea with vomiting, unspecified: Secondary | ICD-10-CM | POA: Diagnosis not present

## 2023-12-23 DIAGNOSIS — R002 Palpitations: Secondary | ICD-10-CM | POA: Diagnosis not present

## 2024-02-08 DIAGNOSIS — D2371 Other benign neoplasm of skin of right lower limb, including hip: Secondary | ICD-10-CM | POA: Diagnosis not present

## 2024-02-08 DIAGNOSIS — L218 Other seborrheic dermatitis: Secondary | ICD-10-CM | POA: Diagnosis not present

## 2024-02-08 DIAGNOSIS — D485 Neoplasm of uncertain behavior of skin: Secondary | ICD-10-CM | POA: Diagnosis not present

## 2024-02-08 DIAGNOSIS — B078 Other viral warts: Secondary | ICD-10-CM | POA: Diagnosis not present

## 2024-05-26 ENCOUNTER — Emergency Department (HOSPITAL_BASED_OUTPATIENT_CLINIC_OR_DEPARTMENT_OTHER): Payer: Self-pay

## 2024-05-26 ENCOUNTER — Emergency Department (HOSPITAL_BASED_OUTPATIENT_CLINIC_OR_DEPARTMENT_OTHER)
Admission: EM | Admit: 2024-05-26 | Discharge: 2024-05-27 | Disposition: A | Discharge status: Home / Self Care | Payer: Self-pay | Attending: Emergency Medicine | Admitting: Emergency Medicine

## 2024-05-26 DIAGNOSIS — D72829 Elevated white blood cell count, unspecified: Secondary | ICD-10-CM | POA: Insufficient documentation

## 2024-05-26 DIAGNOSIS — N12 Tubulo-interstitial nephritis, not specified as acute or chronic: Secondary | ICD-10-CM | POA: Insufficient documentation

## 2024-05-26 DIAGNOSIS — Z91040 Latex allergy status: Secondary | ICD-10-CM | POA: Insufficient documentation

## 2024-05-26 LAB — CBC WITH DIFFERENTIAL/PLATELET
Abs Immature Granulocytes: 0.04 K/uL (ref 0.00–0.07)
Basophils Absolute: 0 K/uL (ref 0.0–0.1)
Basophils Relative: 0 %
Eosinophils Absolute: 0.1 K/uL (ref 0.0–0.5)
Eosinophils Relative: 1 %
HCT: 38.8 % (ref 36.0–46.0)
Hemoglobin: 12.5 g/dL (ref 12.0–15.0)
Immature Granulocytes: 0 %
Lymphocytes Relative: 6 %
Lymphs Abs: 0.8 K/uL (ref 0.7–4.0)
MCH: 28.6 pg (ref 26.0–34.0)
MCHC: 32.2 g/dL (ref 30.0–36.0)
MCV: 88.8 fL (ref 80.0–100.0)
Monocytes Absolute: 1 K/uL (ref 0.1–1.0)
Monocytes Relative: 8 %
Neutro Abs: 10.8 K/uL — ABNORMAL HIGH (ref 1.7–7.7)
Neutrophils Relative %: 85 %
Platelets: 179 K/uL (ref 150–400)
RBC: 4.37 MIL/uL (ref 3.87–5.11)
RDW: 13.7 % (ref 11.5–15.5)
WBC: 12.7 K/uL — ABNORMAL HIGH (ref 4.0–10.5)
nRBC: 0 % (ref 0.0–0.2)

## 2024-05-26 LAB — BASIC METABOLIC PANEL WITH GFR
Anion gap: 13 (ref 5–15)
BUN: 13 mg/dL (ref 6–20)
CO2: 24 mmol/L (ref 22–32)
Calcium: 9.4 mg/dL (ref 8.9–10.3)
Chloride: 101 mmol/L (ref 98–111)
Creatinine, Ser: 0.84 mg/dL (ref 0.44–1.00)
GFR, Estimated: >60 mL/min
Glucose, Bld: 93 mg/dL (ref 70–99)
Potassium: 4.2 mmol/L (ref 3.5–5.1)
Sodium: 139 mmol/L (ref 135–145)

## 2024-05-26 MED ORDER — MORPHINE SULFATE (PF) 4 MG/ML IV SOLN
4.0000 mg | Freq: Once | INTRAVENOUS | Status: AC
  Administered 2024-05-26: 4 mg via INTRAMUSCULAR
  Filled 2024-05-26: qty 1

## 2024-05-26 MED ORDER — SODIUM CHLORIDE 0.9 % IV SOLN
1.0000 g | Freq: Once | INTRAVENOUS | Status: AC
  Administered 2024-05-26: 1 g via INTRAVENOUS
  Filled 2024-05-26: qty 10

## 2024-05-26 MED ORDER — SODIUM CHLORIDE 0.9 % IV BOLUS
1000.0000 mL | Freq: Once | INTRAVENOUS | Status: AC
  Administered 2024-05-26: 1000 mL via INTRAVENOUS

## 2024-05-26 MED ORDER — ONDANSETRON 4 MG PO TBDP
8.0000 mg | ORAL_TABLET | Freq: Once | ORAL | Status: AC
  Administered 2024-05-26: 8 mg via ORAL
  Filled 2024-05-26: qty 2

## 2024-05-26 NOTE — Progress Notes (Signed)
 [address] - AMBULATORY [facility] [address] [place] [address]   Date of Service: 05/26/2024 Patient DOB: 05-31-1995    History of Present Illness   Patient ID: Earnstine Meinders is a 29 y.o. female. Patient presents to urgent care due to right lower quadrant abdominal pain that started last night.  States pain has been significant causing her to get into the fetal position.  States pain starts in right lower quadrant and radiates to her right back. Associated dysuria.  Patient does report similar symptoms the past.  States that she did take Tylenol  without significant relief.  States that she is currently breast-feeding and menstruating.  Denies fever, body aches, chills, dizziness, nausea, vomiting, vaginal discharge.  Active Ambulatory Problems    Diagnosis Date Noted   Attention deficit hyperactivity disorder 05/10/2018   Hearing disorder 05/10/2018   Anxiety disorder 04/04/2021   Sciatica 04/04/2021   History of gestational hypertension 01/04/2023   [redacted] weeks gestation of pregnancy (CMD) 01/25/2023   Obesity in pregnancy (CMD) 01/25/2023   Gastroesophageal reflux disease without esophagitis 05/06/2023   GBS (group B Streptococcus carrier), +RV culture, currently pregnant (CMD) 07/22/2023   SVD (spontaneous vaginal delivery) (CMD) 07/27/2023   Resolved Ambulatory Problems    Diagnosis Date Noted   Gestational hypertension, third trimester (CMD) 06/06/2020   [redacted] weeks gestation of pregnancy (CMD) 04/04/2021   Nausea and vomiting during pregnancy (CMD) 04/04/2021   Depression complicating pregnancy in second trimester, antepartum (CMD) 05/01/2021   Abnormal glucose tolerance affecting pregnancy, antepartum (CMD) 07/03/2021   NSVD (normal spontaneous vaginal delivery) (CMD) 08/14/2021   Pregnancy with inconclusive fetal viability (CMD) 01/04/2023   Normal labor (CMD)  07/26/2023   Past Medical History:  Diagnosis Date   ADHD    Anxiety    Arthritis    GERD (gastroesophageal reflux disease) 03/19/2021   Gestational hypertension (CMD)    Post partum depression     BP 128/79 (BP Location: Left arm, Patient Position: Sitting)   Pulse 104   Ht 1.829 m (6')   Wt 102 kg (225 lb)   BMI 30.52 kg/m    Review of Systems   Review of Systems  Gastrointestinal:  Positive for abdominal pain.  Genitourinary:  Positive for dysuria.     Physicial Exam   Physical Exam Vitals and nursing note reviewed.  Constitutional:      Appearance: Normal appearance. She is normal weight.  HENT:     Head: Normocephalic and atraumatic.     Right Ear: External ear normal.     Left Ear: External ear normal.     Nose: Nose normal.     Mouth/Throat:     Mouth: Mucous membranes are moist.     Pharynx: Oropharynx is clear.  Eyes:     Extraocular Movements: Extraocular movements intact.     Conjunctiva/sclera: Conjunctivae normal.  Cardiovascular:     Rate and Rhythm: Normal rate and regular rhythm.     Pulses: Normal pulses.     Heart sounds: Normal heart sounds.  Pulmonary:     Effort: Pulmonary effort is normal.     Breath sounds: Normal breath sounds.  Abdominal:     General: Abdomen is flat. Bowel sounds are normal. There is no distension.     Palpations: Abdomen is soft. There is no mass.     Tenderness: There is abdominal tenderness (TTP RUQ). There is right CVA tenderness. There  is no left CVA tenderness, guarding or rebound.     Hernia: No hernia is present.  Musculoskeletal:        General: Normal range of motion.     Cervical back: Normal range of motion and neck supple.  Skin:    General: Skin is warm and dry.  Neurological:     General: No focal deficit present.     Mental Status: She is alert and oriented to person, place, and time.  Psychiatric:        Mood and Affect: Mood normal.      Labs   Recent Results (from the past 24  hours)  POC Urinalysis Auto without Microscopic   Collection Time: 05/26/24  2:25 PM  Result Value Ref Range   Color, Urine Dark Yellow (A) Yellow   Clarity, Urine Turbid (A) Clear   Glucose, Urine Negative Negative mg/dL   Bilirubin, Urine Negative Negative   Ketones, Urine Negative Negative mg/dL   Specific Gravity, Urine 1.025 1.010, 1.015, 1.020, 1.025   Blood, Urine Large (A) Negative   pH, Urine 7.0 5.0, 5.5, 6.0, 6.5, 7.0, 7.5, 8.0   Protein, Urine >=300 (A) Negative mg/dL   Urobilinogen, Urine 0.2 <2.0 mg/dL   Nitrite, Urine Positive (A) Negative   Leukocyte Esterase, Urine Large (A) Negative   Kit/Device Lot # [id number]    Kit/Device Expiration Date 02/15/2025   POC HCG Qualitative, Urine   Collection Time: 05/26/24  2:29 PM  Result Value Ref Range   HCG, Urine, POC Negative Negative   Internal Control Acceptable    Kit/Device Lot # [id number]    Kit/Device Expiration Date 10/16/2025      Diagnosis   Jomarie was seen today for abdominal pain.  Diagnoses and all orders for this visit:  Urinary tract infection without hematuria, site unspecified -     cefTRIAXone  (ROCEPHIN ) injection 1 g -     lidocaine (XYLOCAINE) 10 mg/mL (1 %) injection 10 mg -     cephALEXin  (KEFLEX ) 500 mg capsule; Take 1 capsule (500 mg total) by mouth 2 (two) times a day for 7 days. -     Urine Culture  Right upper quadrant abdominal pain -     POC HCG Qualitative, Urine -     POC Urinalysis Auto without Microscopic     Medical Decision Making Sunjai Levandoski is a 29 y.o. female who presents to urgent care today with complaints of  Chief Complaint  Patient presents with   Abdominal Pain    Pt reports some lower right quadrant abdominal pain, she states it is radiating to her back, around her right flank, she states it began yesterday. She reports tylenol  for the pain, that has helped slightly. She is currently on her menstrual cycle. She reports her urine is very dark, cloudy. She states  the last time this happened she reports she had a large kidney infection. Pt denies any issues urinating. She reports a burning sensation when urinating, as well as a foul odor.    On exam, VS stable and patient is afebrile. Appears well-hydrated and capillary refill <2 seconds.  Differential diagnosis includes: Appendicitis, Bowel Obstruction, AAA, UTI, Pyelonephritis, Nephrolithiasis, Pancreatitis, Cholecystitis, Shingles, Perforated Bowel or Ulcer, Diverticulosis/itis, Ischemic Mesentery, Inflammatory Bowel Disease, Strangulated/Incarcerated Hernia, constipation PID, Intrauterine Pregnancy, Ectopic Pregnancy, Ovarian Torsion, Tubal Ovarian Abscess, STD, mittelschmerz.   Patient presents to urgent care due to right sided abdominal pain.  On exam, patient does appear to have significant tenderness of right  upper quadrant and right CVA.  Urine dip significant for large blood, leukocytes, nitrites; will send for culture.  Possible hematuria secondary to current menstruation.  Rocephin  given in clinic along with outpatient course of Keflex .  Extensive discussion and supportive care return precautions provided.  Discharge Information  Symptomatic management discussed.  Patient was given verbal and written instructions on symptoms that necessitate return to the UC/ED, and instructed to f/u w/ UC or PCP if not improving in expected timeframe.   Patient/parent has been instructed on RX/OTC medications, dosages, side effects, and possible interactions as associated with each diagnosis in my impression and plan above.   Patient education (verbal/handout) given on diagnosis, pathophysiology, treatment of diagnosis, side effects of medication use for treatment, restrictions while taking medication, supportives measures such as staying hydrated.   Red Flags associated with diagnosis/es were reviewed and patient instructed on action plan if red flags develop.   They have been instructed that if symptoms worsen  or red flags develop they should return to Urgent Care, go to the nearest ED, or activate EMS/911.     Patient and/or parent/guardian (if applicable) agreed with plan and voiced understanding.  No barriers to adherence perceived by myself.   Portions of this note may have been dictated using Dragon dictation software/hardware and may contain grammatical or spelling errors.    If a new prescription was given today, then I discussed potential side effects, drug interactions, instructions for taking the medication, and the consequences of not taking it.    F/u: Follow up closely with primary care provider (PCP) and other specialists for further care and routine care, but seek medical attention sooner if worsening/concerning signs or symptoms.  Home Care   Electronically signed by: Darryle Slater Fish, PA-C 05/26/2024 2:52 PM

## 2024-05-26 NOTE — ED Provider Notes (Signed)
 " [place] EMERGENCY DEPARTMENT AT [facility] Provider Note   CSN: [id number] Arrival date & time: 05/26/24  1918     Patient presents with: Flank Pain   Cindy Boyle is a 29 y.o. female who presents emergency department chief complaint of nausea vomiting and flank pain.  She has a history of recurrent urinary tract infection.  Patient was seen at urgent care this morning for the same.  She has been having shaking chills and fever and had positive UTI was given IM Rocephin  and discharged on Keflex .  This afternoon she had a high fever shaking chills worsening pain presented the emergency department due to severe vomiting and flank pain.    Flank Pain       Prior to Admission medications  Medication Sig Start Date End Date Taking? Authorizing Provider  ondansetron  (ZOFRAN -ODT) 4 MG disintegrating tablet Take 1 tablet (4 mg total) by mouth every 8 (eight) hours as needed for nausea or vomiting. 05/27/24  Yes Jonluke Cobbins, PA-C  oxyCODONE  (ROXICODONE ) 5 MG immediate release tablet Take 0.5-1 tablets (2.5-5 mg total) by mouth every 4 (four) hours as needed for severe pain (pain score 7-10). 05/27/24  Yes Sherian Valenza, PA-C  cefadroxil  (DURICEF) 500 MG capsule Take 1 capsule (500 mg total) by mouth 2 (two) times daily. 01/27/23   Synthia Raisin, CNM  Doxylamine -Pyridoxine  10-10 MG TBEC Take 1 tablet by mouth at bedtime. If you have ongoing nausea you can take a second pill in the morning 01/25/23   Emil Share, DO  fluticasone  (FLONASE ) 50 MCG/ACT nasal spray Place 1 spray into both nostrils daily. 03/22/20   Hall-Potvin, Brittany, PA-C  loratadine (CLARITIN) 10 MG tablet Take 10 mg by mouth daily.    [provider]  metoCLOPramide  (REGLAN ) 10 MG tablet Take 1 tablet (10 mg total) by mouth every 6 (six) hours. 01/25/23   Emil Share, DO  ofloxacin  (OCUFLOX ) 0.3 % ophthalmic solution Place 1 drop into the left eye 4 (four) times daily. 06/04/22   Raspet, Erin K, PA-C     Allergies: Banana, Cherry, Corn oil, Daucus carota, Other, Dust mite extract, Molds & smuts, Tree extract, Acetaminophen , and Latex    Review of Systems  Genitourinary:  Positive for flank pain.    Updated Vital Signs BP 120/70 (BP Location: Left Arm)   Pulse (!) 102   Temp 98.2 F (36.8 C) (Oral)   Resp 18   Ht 6' (1.829 m)   Wt 102.1 kg   LMP 05/25/2024   SpO2 95%   Breastfeeding Yes   BMI 30.52 kg/m   Physical Exam Vitals and nursing note reviewed.  Constitutional:      General: She is not in acute distress.    Appearance: She is well-developed. She is ill-appearing. She is not diaphoretic.  HENT:     Head: Normocephalic and atraumatic.     Right Ear: External ear normal.     Left Ear: External ear normal.     Nose: Nose normal.     Mouth/Throat:     Mouth: Mucous membranes are moist.  Eyes:     General: No scleral icterus.    Conjunctiva/sclera: Conjunctivae normal.  Cardiovascular:     Rate and Rhythm: Normal rate and regular rhythm.     Heart sounds: Normal heart sounds. No murmur heard.    No friction rub. No gallop.  Pulmonary:     Effort: Pulmonary effort is normal. No respiratory distress.     Breath sounds: Normal  breath sounds.  Abdominal:     General: Bowel sounds are normal. There is no distension.     Palpations: Abdomen is soft. There is no mass.     Tenderness: There is no abdominal tenderness. There is right CVA tenderness. There is no guarding.  Musculoskeletal:     Cervical back: Normal range of motion.  Skin:    General: Skin is warm and dry.  Neurological:     Mental Status: She is alert and oriented to person, place, and time.  Psychiatric:        Behavior: Behavior normal.     (all labs ordered are listed, but only abnormal results are displayed) Labs Reviewed  CBC WITH DIFFERENTIAL/PLATELET - Abnormal; Notable for the following components:      Result Value   WBC 12.7 (*)    Neutro Abs 10.8 (*)    All other components  within normal limits  URINE CULTURE  BASIC METABOLIC PANEL WITH GFR    EKG: None  Radiology: CT Renal Stone Study Result Date: 05/26/2024 EXAM: CT ABDOMEN AND PELVIS WITHOUT CONTRAST 05/26/2024 08:45:39 PM TECHNIQUE: CT of the abdomen and pelvis was performed without the administration of intravenous contrast. Multiplanar reformatted images are provided for review. Automated exposure control, iterative reconstruction, and/or weight-based adjustment of the mA/kV was utilized to reduce the radiation dose to as low as reasonably achievable. COMPARISON: 02/12/2022 CLINICAL HISTORY: Abdominal/flank pain, stone suspected; flank pain. FINDINGS: LOWER CHEST: No acute abnormality. LIVER: The liver is unremarkable. GALLBLADDER AND BILE DUCTS: Gallbladder is unremarkable. No biliary ductal dilatation. SPLEEN: No acute abnormality. PANCREAS: No acute abnormality. ADRENAL GLANDS: No acute abnormality. KIDNEYS, URETERS AND BLADDER: No stones in the kidneys or ureters. No hydronephrosis. Although poorly evaluated on unenhanced CT, very mild right perinephric stranding is favored inferiorly (image 40), raising the possibility of pyelonephritis. Mildly thick-walled bladder, although underdistended. Correlate for cystitis. GI AND BOWEL: Stomach demonstrates no acute abnormality. There is no bowel obstruction. Normal appendix (image 58). PERITONEUM AND RETROPERITONEUM: No ascites. No free air. VASCULATURE: Aorta is normal in caliber. LYMPH NODES: No lymphadenopathy. REPRODUCTIVE ORGANS: The uterus is within normal limits. BONES AND SOFT TISSUES: No acute osseous abnormality. No focal soft tissue abnormality. IMPRESSION: 1. Mildly thick-walled bladder, although underdistended, correlate for cystitis. 2. Possible right pyelonephritis, equivocal. Electronically signed by: Pinkie Pebbles MD MD 05/26/2024 08:54 PM EST RP Workstation: [id number]     Procedures   Medications Ordered in the ED  ondansetron  (ZOFRAN -ODT)  disintegrating tablet 8 mg (8 mg Oral Given 05/26/24 2016)  morphine  (PF) 4 MG/ML injection 4 mg (4 mg Intramuscular Given 05/26/24 2017)  sodium chloride  0.9 % bolus 1,000 mL (0 mLs Intravenous Stopped 05/26/24 2300)  cefTRIAXone  (ROCEPHIN ) 1 g in sodium chloride  0.9 % 100 mL IVPB (0 g Intravenous Stopped 05/26/24 2353)  oxyCODONE  (Oxy IR/ROXICODONE ) immediate release tablet 5 mg (5 mg Oral Given 05/27/24 0031)                                    Medical Decision Making Amount and/or Complexity of Data Reviewed Labs: ordered. Radiology: ordered.  Risk Prescription drug management.   This patient presents to the ED for concern of flank pain , this involves an extensive number of treatment options, and is a complaint that carries with it a high risk of complications and morbidity.  The differential diagnosis of emergent flank pain includes, but is not limited  to :Abdominal aortic aneurysm,, Renal artery embolism,Renal vein thrombosis, Aortic dissection, Mesenteric ischemia, Pyelonephritis, Renal infarction, Renal hemorrhage, Nephrolithiasis/ Renal Colic, Bladder tumor,Cystitis, Biliary colic, Pancreatitis Perforated peptic ulcer Appendicitis ,Inguinal Hernia, Diverticulitis, Bowel obstruction Ectopic Pregnancy,PID/TOA,Ovarian cyst, Ovarian torsion, shingles, Lower lobe pneumonia, Retroperitoneal hematoma/abscess/tumor, Epidural abscess, Epidural hematoma   Co morbidities:   has a past medical history of Allergy, Arthritis, Asthma, Heart murmur, and Migraines.  Social Determinants of Health:  . SDOH Screenings   Food Insecurity: Low Risk (05/26/2024)   Received from [facility]  Housing: Low Risk (05/26/2024)   Received from [facility]  Transportation Needs: No Transportation Needs (05/26/2024)   Received from [facility]  Utilities: Low Risk (07/26/2023)   Received from [facility]  Tobacco Use: Low Risk (05/26/2024)   Received from [facility]        Additional  history:  {Additional history obtained from EMR, friend at bedside {External records from outside source obtained and reviewed including previous urine culture which included last urine culture from 2024 in September that showed greater than values of E. coli with resistance to amoxicillin . Previous labs from urgent care visit earlier today, negative pregnancy test Lab Tests:  I Ordered, and personally interpreted labs.  The pertinent results include:   CBC and BMP mildly elevated white blood cell count  Imaging Studies:  I ordered imaging studies including CT renal stone study I independently visualized and interpreted imaging which showed bladder wall thickening and right sided pyelonephritis consistent with presentation I agree with the radiologist interpretation  Cardiac Monitoring/ECG:  The patient was maintained on a cardiac monitor.  I personally viewed and interpreted the cardiac monitored which showed an underlying rhythm of:   Medicines ordered and prescription drug management:  I ordered medication including  Medications  ondansetron  (ZOFRAN -ODT) disintegrating tablet 8 mg (8 mg Oral Given 05/26/24 2016)  morphine  (PF) 4 MG/ML injection 4 mg (4 mg Intramuscular Given 05/26/24 2017)  sodium chloride  0.9 % bolus 1,000 mL (0 mLs Intravenous Stopped 05/26/24 2300)  cefTRIAXone  (ROCEPHIN ) 1 g in sodium chloride  0.9 % 100 mL IVPB (0 g Intravenous Stopped 05/26/24 2353)  oxyCODONE  (Oxy IR/ROXICODONE ) immediate release tablet 5 mg (5 mg Oral Given 05/27/24 0031)   for flank pain nausea vomiting and acute pyelonephritis Reevaluation of the patient after these medicines showed that the patient improved I have reviewed the patients home medicines and have made adjustments as needed  Test Considered:    Critical Interventions:    Consultations Obtained:   Problem List / ED Course:     ICD-10-CM   1. Pyelonephritis  N12       MDM: Patient here with pyelonephritis.  She  initially came in with severe pain and vomiting.  No sign of obstructing renal stone.  In shared decision making patient is feeling improved states that she would like to try discharge with pain medications, continuing antibiotic and antiemetic use.   Dispostion:  After consideration of the diagnostic results and the patients response to treatment, I feel that the patent would benefit from discharge with strict return precautions PDMP reviewed during this encounter. .      Final diagnoses:  Pyelonephritis    ED Discharge Orders          Ordered    oxyCODONE  (ROXICODONE ) 5 MG immediate release tablet  Every 4 hours PRN        05/27/24 0004    ondansetron  (ZOFRAN -ODT) 4 MG disintegrating tablet  Every 8 hours PRN  05/27/24 0004               Arloa Chroman, PA-C 05/27/24 1233  "

## 2024-05-26 NOTE — ED Notes (Signed)
 Pt returned to triage room, vomiting and c/o increased rt. Sided flank pain EDP made aware and orders received

## 2024-05-26 NOTE — ED Triage Notes (Addendum)
 States she has had UTI symptoms x 3 days C/o rt. Flank pain +fever Was seen at [facility] today and prescribed Keflex  and given Rocephin  IM Urinalysis from today is in paperwork She is mainly here for the pain.

## 2024-05-27 MED ORDER — ONDANSETRON 4 MG PO TBDP: 4.0000 mg | ORAL_TABLET | Freq: Three times a day (TID) | ORAL | 0 refills | Status: AC | PRN

## 2024-05-27 MED ORDER — OXYCODONE HCL 5 MG PO TABS
5.0000 mg | ORAL_TABLET | Freq: Once | ORAL | Status: AC
  Administered 2024-05-27: 5 mg via ORAL
  Filled 2024-05-27: qty 1

## 2024-05-27 MED ORDER — OXYCODONE HCL 5 MG PO TABS: 2.5000 mg | ORAL_TABLET | ORAL | 0 refills | Status: AC | PRN

## 2024-05-27 NOTE — Discharge Instructions (Signed)
Contact a health care provider if: Your symptoms do not get better after 2 days of treatment. Your symptoms get worse. You have a fever or chills. You cannot take your antibiotics. Get help right away if: You vomit each time that you eat or drink. You have severe pain in your back or side. You are very weak, or you faint. This information is not intended to replace advice given to you by your health care provider. Make sure you discuss any questions you have with your health care provider.
